# Patient Record
Sex: Male | Born: 1945
Health system: Southern US, Community
[De-identification: ages and names within clinical notes are randomized; demographics above are authoritative.]

## PROBLEM LIST (undated history)

## (undated) DIAGNOSIS — R600 Localized edema: Secondary | ICD-10-CM

## (undated) DIAGNOSIS — L03119 Cellulitis of unspecified part of limb: Secondary | ICD-10-CM

## (undated) DIAGNOSIS — I471 Supraventricular tachycardia, unspecified: Secondary | ICD-10-CM

## (undated) DIAGNOSIS — M5416 Radiculopathy, lumbar region: Secondary | ICD-10-CM

## (undated) DIAGNOSIS — M109 Gout, unspecified: Secondary | ICD-10-CM

## (undated) DIAGNOSIS — M1990 Unspecified osteoarthritis, unspecified site: Secondary | ICD-10-CM

## (undated) DIAGNOSIS — L509 Urticaria, unspecified: Secondary | ICD-10-CM

## (undated) HISTORY — DX: Supraventricular tachycardia: I47.1

## (undated) HISTORY — DX: Urticaria, unspecified: L50.9

## (undated) HISTORY — PX: APPENDECTOMY: SHX54

## (undated) HISTORY — DX: Localized edema: R60.0

## (undated) HISTORY — DX: Supraventricular tachycardia, unspecified: I47.10

## (undated) HISTORY — DX: Radiculopathy, lumbar region: M54.16

## (undated) HISTORY — DX: Unspecified osteoarthritis, unspecified site: M19.90

## (undated) HISTORY — DX: Cellulitis of unspecified part of limb: L03.119

---

## 2003-07-06 ENCOUNTER — Ambulatory Visit (HOSPITAL_COMMUNITY): Admission: RE | Admit: 2003-07-06 | Discharge: 2003-07-06 | Payer: Self-pay | Admitting: Internal Medicine

## 2003-12-16 ENCOUNTER — Ambulatory Visit (HOSPITAL_COMMUNITY): Admission: RE | Admit: 2003-12-16 | Discharge: 2003-12-16 | Payer: Self-pay | Admitting: Internal Medicine

## 2009-12-18 ENCOUNTER — Ambulatory Visit: Payer: Self-pay | Admitting: Family Medicine

## 2010-01-15 ENCOUNTER — Emergency Department (HOSPITAL_COMMUNITY): Admission: EM | Admit: 2010-01-15 | Discharge: 2010-01-16 | Payer: Self-pay | Admitting: Emergency Medicine

## 2010-01-23 ENCOUNTER — Ambulatory Visit: Payer: Self-pay | Admitting: Internal Medicine

## 2010-01-23 ENCOUNTER — Encounter (INDEPENDENT_AMBULATORY_CARE_PROVIDER_SITE_OTHER): Payer: Self-pay | Admitting: Family Medicine

## 2010-01-23 LAB — CONVERTED CEMR LAB
ALT: 8 units/L (ref 0–53)
AST: 12 units/L (ref 0–37)
Alkaline Phosphatase: 55 units/L (ref 39–117)
BUN: 11 mg/dL (ref 6–23)
Creatinine, Ser: 1.2 mg/dL (ref 0.40–1.50)
HDL: 37 mg/dL — ABNORMAL LOW (ref >39)
LDL Cholesterol: 163 mg/dL — ABNORMAL HIGH (ref 0–99)
Potassium: 3.8 meq/L (ref 3.5–5.3)
Total CHOL/HDL Ratio: 5.8

## 2010-01-29 ENCOUNTER — Ambulatory Visit (HOSPITAL_COMMUNITY): Admission: RE | Admit: 2010-01-29 | Discharge: 2010-01-29 | Payer: Self-pay | Admitting: Family Medicine

## 2010-02-26 ENCOUNTER — Ambulatory Visit: Payer: Self-pay | Admitting: Internal Medicine

## 2010-03-07 ENCOUNTER — Observation Stay (HOSPITAL_COMMUNITY): Admission: EM | Admit: 2010-03-07 | Discharge: 2010-03-08 | Payer: Self-pay | Admitting: Emergency Medicine

## 2010-03-09 ENCOUNTER — Emergency Department (HOSPITAL_COMMUNITY)
Admission: EM | Admit: 2010-03-09 | Discharge: 2010-03-10 | Payer: Self-pay | Source: Home / Self Care | Admitting: Emergency Medicine

## 2010-05-31 ENCOUNTER — Encounter: Payer: Self-pay | Admitting: Internal Medicine

## 2010-07-23 LAB — CBC
HCT: 44.3 % (ref 39.0–52.0)
Hemoglobin: 15.4 g/dL (ref 13.0–17.0)
MCH: 31.3 pg (ref 26.0–34.0)
MCHC: 34.4 g/dL (ref 30.0–36.0)
MCV: 90.3 fL (ref 78.0–100.0)
Platelets: 214 10*3/uL (ref 150–400)
Platelets: 223 10*3/uL (ref 150–400)
RBC: 4.8 MIL/uL (ref 4.22–5.81)
RBC: 4.91 MIL/uL (ref 4.22–5.81)
RDW: 14.8 % (ref 11.5–15.5)
WBC: 4.1 10*3/uL (ref 4.0–10.5)

## 2010-07-23 LAB — URINALYSIS, ROUTINE W REFLEX MICROSCOPIC
Bilirubin Urine: NEGATIVE
Glucose, UA: NEGATIVE mg/dL
Hgb urine dipstick: NEGATIVE
Ketones, ur: NEGATIVE mg/dL
Protein, ur: NEGATIVE mg/dL

## 2010-07-23 LAB — DIFFERENTIAL
Basophils Absolute: 0 10*3/uL (ref 0.0–0.1)
Basophils Relative: 0 % (ref 0–1)
Eosinophils Relative: 2 % (ref 0–5)
Monocytes Absolute: 0.3 10*3/uL (ref 0.1–1.0)
Neutro Abs: 2.5 10*3/uL (ref 1.7–7.7)

## 2010-07-23 LAB — COMPREHENSIVE METABOLIC PANEL
AST: 15 U/L (ref 0–37)
Albumin: 4.1 g/dL (ref 3.5–5.2)
Albumin: 4.2 g/dL (ref 3.5–5.2)
Alkaline Phosphatase: 52 U/L (ref 39–117)
BUN: 11 mg/dL (ref 6–23)
Calcium: 9.3 mg/dL (ref 8.4–10.5)
Chloride: 108 mEq/L (ref 96–112)
Creatinine, Ser: 1.1 mg/dL (ref 0.4–1.5)
GFR calc Af Amer: >60 mL/min (ref >60)
Glucose, Bld: 89 mg/dL (ref 70–99)
Potassium: 3.8 mEq/L (ref 3.5–5.1)
Total Bilirubin: 0.7 mg/dL (ref 0.3–1.2)
Total Protein: 7.9 g/dL (ref 6.0–8.3)

## 2010-07-23 LAB — TSH: TSH: 0.588 u[IU]/mL (ref 0.350–4.500)

## 2010-10-20 ENCOUNTER — Emergency Department (HOSPITAL_COMMUNITY)
Admission: EM | Admit: 2010-10-20 | Discharge: 2010-10-20 | Disposition: A | Discharge status: Home / Self Care | Payer: Medicare Other | Attending: Emergency Medicine | Admitting: Emergency Medicine

## 2010-10-20 DIAGNOSIS — M25529 Pain in unspecified elbow: Secondary | ICD-10-CM | POA: Insufficient documentation

## 2010-10-20 DIAGNOSIS — E785 Hyperlipidemia, unspecified: Secondary | ICD-10-CM | POA: Insufficient documentation

## 2010-10-20 DIAGNOSIS — M109 Gout, unspecified: Secondary | ICD-10-CM | POA: Insufficient documentation

## 2010-10-20 DIAGNOSIS — R0602 Shortness of breath: Secondary | ICD-10-CM | POA: Insufficient documentation

## 2010-10-20 DIAGNOSIS — I10 Essential (primary) hypertension: Secondary | ICD-10-CM | POA: Insufficient documentation

## 2010-10-20 DIAGNOSIS — M25519 Pain in unspecified shoulder: Secondary | ICD-10-CM | POA: Insufficient documentation

## 2010-10-20 LAB — POCT I-STAT, CHEM 8
BUN: 17 mg/dL (ref 6–23)
Chloride: 107 mEq/L (ref 96–112)
HCT: 43 % (ref 39.0–52.0)
Sodium: 139 mEq/L (ref 135–145)

## 2010-11-04 ENCOUNTER — Emergency Department (HOSPITAL_COMMUNITY)
Admission: EM | Admit: 2010-11-04 | Discharge: 2010-11-04 | Disposition: A | Discharge status: Home / Self Care | Payer: Medicare Other | Attending: Emergency Medicine | Admitting: Emergency Medicine

## 2010-11-04 DIAGNOSIS — M25439 Effusion, unspecified wrist: Secondary | ICD-10-CM | POA: Insufficient documentation

## 2010-11-04 DIAGNOSIS — M109 Gout, unspecified: Secondary | ICD-10-CM | POA: Insufficient documentation

## 2010-11-04 DIAGNOSIS — E785 Hyperlipidemia, unspecified: Secondary | ICD-10-CM | POA: Insufficient documentation

## 2010-11-04 DIAGNOSIS — I10 Essential (primary) hypertension: Secondary | ICD-10-CM | POA: Insufficient documentation

## 2010-11-04 DIAGNOSIS — M25539 Pain in unspecified wrist: Secondary | ICD-10-CM | POA: Insufficient documentation

## 2010-11-04 DIAGNOSIS — M7989 Other specified soft tissue disorders: Secondary | ICD-10-CM | POA: Insufficient documentation

## 2013-02-22 ENCOUNTER — Encounter (HOSPITAL_COMMUNITY): Payer: Self-pay | Admitting: Emergency Medicine

## 2013-02-22 ENCOUNTER — Emergency Department (HOSPITAL_COMMUNITY)
Admission: EM | Admit: 2013-02-22 | Discharge: 2013-02-22 | Disposition: A | Discharge status: Home / Self Care | Payer: Medicare PPO | Attending: Emergency Medicine | Admitting: Emergency Medicine

## 2013-02-22 DIAGNOSIS — R0789 Other chest pain: Secondary | ICD-10-CM

## 2013-02-22 DIAGNOSIS — R209 Unspecified disturbances of skin sensation: Secondary | ICD-10-CM | POA: Insufficient documentation

## 2013-02-22 DIAGNOSIS — R7989 Other specified abnormal findings of blood chemistry: Secondary | ICD-10-CM | POA: Insufficient documentation

## 2013-02-22 DIAGNOSIS — R0602 Shortness of breath: Secondary | ICD-10-CM | POA: Insufficient documentation

## 2013-02-22 DIAGNOSIS — R000 Tachycardia, unspecified: Secondary | ICD-10-CM | POA: Insufficient documentation

## 2013-02-22 DIAGNOSIS — F411 Generalized anxiety disorder: Secondary | ICD-10-CM | POA: Insufficient documentation

## 2013-02-22 DIAGNOSIS — F17200 Nicotine dependence, unspecified, uncomplicated: Secondary | ICD-10-CM | POA: Insufficient documentation

## 2013-02-22 LAB — CBC
HCT: 49.1 % (ref 39.0–52.0)
MCH: 33.5 pg (ref 26.0–34.0)
MCV: 96.8 fL (ref 78.0–100.0)
Platelets: 250 10*3/uL (ref 150–400)
RBC: 5.07 MIL/uL (ref 4.22–5.81)
RDW: 15.2 % (ref 11.5–15.5)

## 2013-02-22 LAB — RAPID URINE DRUG SCREEN, HOSP PERFORMED
Amphetamines: NOT DETECTED
Cocaine: NOT DETECTED
Opiates: NOT DETECTED
Tetrahydrocannabinol: POSITIVE — AB

## 2013-02-22 LAB — POCT I-STAT, CHEM 8
BUN: 14 mg/dL (ref 6–23)
Creatinine, Ser: 1.4 mg/dL — ABNORMAL HIGH (ref 0.50–1.35)
Glucose, Bld: 112 mg/dL — ABNORMAL HIGH (ref 70–99)
HCT: 57 % — ABNORMAL HIGH (ref 39.0–52.0)
Hemoglobin: 19.4 g/dL — ABNORMAL HIGH (ref 13.0–17.0)
Potassium: 4 mEq/L (ref 3.5–5.1)
TCO2: 21 mmol/L (ref 0–100)

## 2013-02-22 NOTE — ED Notes (Signed)
Lab called about Troponin test result, stated Troponin is running right now.

## 2013-02-22 NOTE — ED Provider Notes (Signed)
CSN: [id number]     Arrival date & time 02/22/13  1435 History   First MD Initiated Contact with Patient 02/22/13 1545     Chief Complaint  Patient presents with  . Anxiety   (Consider location/radiation/quality/duration/timing/severity/associated sxs/prior Treatment) HPI  Patient is a 67 yo male with no past medical history as he has not seen a physician in many years who presents following episode of tingling fingers, sweating, and shortness of breath. Started around 3 pm following drinking alcohol and smoking marijuana earlier today. Notes no palpitations. Notes while en route to hospital developed discomfort at left collar bone and left chest without radiation. Notes has never had this before. Never had exertional chest pain or dyspnea. Is not on medications. Notes has not seen a physician in many years. Notes no other illicits. States does not drink every day.  History reviewed. No pertinent past medical history. No past surgical history on file. No family history on file. Notes family history of HTN and DM. History  Substance Use Topics  . Smoking status: Current Some Day Smoker  . Smokeless tobacco: Not on file  . Alcohol Use: Yes    Review of Systems  Respiratory: Positive for shortness of breath.   Cardiovascular:       Left sided chest discomfort  Gastrointestinal: Negative for nausea and abdominal pain.  Neurological: Negative for dizziness and light-headedness.       Tingling in hands bilaterally    Allergies  Review of patient's allergies indicates no known allergies.  Home Medications  No current outpatient prescriptions on file. BP 116/74  Pulse 115  Temp(Src) 97.8 F (36.6 C) (Oral)  Resp 20  SpO2 96% Physical Exam  Constitutional: He appears well-developed and well-nourished.  HENT:  Head: Normocephalic and atraumatic.  Mouth/Throat: Oropharynx is clear and moist.  Eyes: Pupils are equal, round, and reactive to light.  Neck: Neck supple.   Cardiovascular: Normal heart sounds.   Tachycardic   Pulmonary/Chest: Effort normal and breath sounds normal.  Abdominal: Soft. Bowel sounds are normal. He exhibits no distension. There is no tenderness.  Musculoskeletal: He exhibits no edema.  Neurological: He is alert.  Skin: Skin is warm and dry.    ED Course  Procedures (including critical care time) Labs Review Labs Reviewed  URINE RAPID DRUG SCREEN (HOSP PERFORMED) - Abnormal; Notable for the following:    Tetrahydrocannabinol POSITIVE (*)    All other components within normal limits  ETHANOL - Abnormal; Notable for the following:    Alcohol, Ethyl (B) 179 (*)    All other components within normal limits  POCT I-STAT, CHEM 8 - Abnormal; Notable for the following:    Creatinine, Ser 1.40 (*)    Glucose, Bld 112 (*)    Calcium, Ion 1.05 (*)    Hemoglobin 19.4 (*)    HCT 57.0 (*)    All other components within normal limits  CBC  TROPONIN I  TROPONIN T   Imaging Review No results found.  EKG Interpretation   None      EKG: sinus tachycardia, no apparent ST or T wave changes  MDM   1. Chest discomfort   2. Elevated serum creatinine    4:00 pm: patient seen and examined. Was drinking and had smoked marijuana when he states all of the sudden he developed a tingling in his bilateral fingers, diaphoresis, and shortness of breath.  He felt no palpitations at that time. En route to the hospital he developed discomfort starting at  his left collar bone and left chest without further radiation. This concerning for cardiac cause of symptoms. Patient is back to base line at this time. Will check troponin given recent onset of symptoms. Will additionally obtain drug screen as patient smoked marijuana, concern is for additional drugs in the marijuana he smoked. Will check alcohol level as well. Will check CBC and i-stat chem 8. Patient states is at his baseline at this time.  6:00 pm: spoke with patient, states he is feeling  well at this time. Wants to know that everything is ok. He states he is ready to leave once his labs return. Note UDS positive for THC. Renal function with Cr of 1.4, previously was 1-1.1 2 years ago. Hgb 17. Will await return of troponin.  6:40 pm: troponin returned as normal. Discussed that we would like to recheck this to have 2 negative troponins prior to discharge from the ED. Patient stated he was ready to leave and did not want to wait for an additional lab result as he felt fine at this time. Most likely this episode was related to his alcohol consumption or marijuana use. No indication of ACS on labs or EKG. Patient given resource guide for PCP follow-up. Advised to follow-up with primary doctor regarding likely chronic kidney disease. Given return precautions.  This patient was discussed and seen with my attending Dr Anitra Lauth.  Marikay Alar, MD [facility] PGY-2 02/22/13 6:57 pm  Glori Luis, MD 02/22/13 2231

## 2013-02-22 NOTE — Progress Notes (Signed)
   CARE MANAGEMENT ED NOTE 02/22/2013  Patient:  Sergio Montgomery, Sergio Montgomery   Account Number:  [id number]  Date Initiated:  02/22/2013  Documentation initiated by:  Sergio Montgomery  Subjective/Objective Assessment:   67 Year old male medicare Sergio Montgomery resident c/o anxiety without pcp listed in EPIC Pt agrees to receive a list of medicare pcps within a radius of his zip code and has show interest in Dr Sergio Montgomery services     Subjective/Objective Assessment Detail:   Pt confirms no pcp but had been looking for one     Action/Plan:   Cm spoke with pt Cm provided pt with a 6 page list of medicare pcps within 5 mile radius of [phone] and verified Sergio Montgomery is an active medicare provider for [phone] zip code   Action/Plan Detail:   Anticipated DC Date:  02/22/2013     Status Recommendation to Physician:   Result of Recommendation:    Other ED Services  Consult Working Plan    DC Planning Services  Other  Outpatient Services - Pt will follow up  PCP issues    Choice offered to / List presented to:            Status of service:  Completed, signed off  ED Comments:   ED Comments Detail:

## 2013-02-22 NOTE — ED Notes (Signed)
Bed: WTR9 Expected date:  Expected time:  Means of arrival:  Comments: 

## 2013-02-22 NOTE — ED Notes (Signed)
Pupils pinpoint.  Pt states he had liquor, beer, and marijuana today.

## 2013-02-22 NOTE — ED Notes (Signed)
Lab called, person from lab stated Troponin was never run because it was ordered incorrectly. Requested that Troponin be re-ordered.

## 2013-02-22 NOTE — ED Notes (Signed)
Per EMS: Pt from home.  Was watching TV when he "started feeling weird".  Pt cannot describe it.  Just states it doesn't feel normal, pt feels nervous.  Pt was hyperventilating when EMS arrived, tachy at 140.  Pt now HR 113.  Sinus tach on monitor.  No pain.  Tingling in hands.

## 2013-02-23 ENCOUNTER — Telehealth (HOSPITAL_COMMUNITY): Payer: Self-pay | Admitting: Emergency Medicine

## 2013-02-23 NOTE — ED Provider Notes (Signed)
I saw and evaluated the patient, reviewed the resident's note and I agree with the findings and plan. I have reviewed EKG and agree with the resident interpretation.  you Pt with arm tingling/chest pain that started after drinking and using marijuana.  Pt denies any sx now.  Iniital labs including troponin and EKG wnl.  Discussed delta trop but pt wants to leave.  Gwyneth Sprout, MD 02/23/13 [phone]

## 2013-02-23 NOTE — ED Notes (Signed)
Call from lab not enough blood for Trop T.  Pt had (-) Trop I.

## 2013-05-16 ENCOUNTER — Ambulatory Visit (HOSPITAL_COMMUNITY)
Admission: RE | Admit: 2013-05-16 | Discharge: 2013-05-16 | Disposition: A | Discharge status: Home / Self Care | Payer: Medicare PPO | Source: Ambulatory Visit | Attending: Pulmonary Disease | Admitting: Pulmonary Disease

## 2013-05-16 ENCOUNTER — Other Ambulatory Visit (HOSPITAL_COMMUNITY): Payer: Self-pay | Admitting: Pulmonary Disease

## 2013-05-16 DIAGNOSIS — R05 Cough: Secondary | ICD-10-CM

## 2013-05-16 DIAGNOSIS — R059 Cough, unspecified: Secondary | ICD-10-CM | POA: Insufficient documentation

## 2013-05-16 DIAGNOSIS — Z87891 Personal history of nicotine dependence: Secondary | ICD-10-CM | POA: Insufficient documentation

## 2013-08-03 ENCOUNTER — Ambulatory Visit (HOSPITAL_COMMUNITY)
Admission: RE | Admit: 2013-08-03 | Discharge: 2013-08-03 | Disposition: A | Discharge status: Home / Self Care | Payer: Medicare PPO | Source: Ambulatory Visit | Attending: Pulmonary Disease | Admitting: Pulmonary Disease

## 2013-08-03 ENCOUNTER — Other Ambulatory Visit (HOSPITAL_COMMUNITY): Payer: Self-pay | Admitting: Pulmonary Disease

## 2013-08-03 DIAGNOSIS — R222 Localized swelling, mass and lump, trunk: Secondary | ICD-10-CM

## 2013-08-03 DIAGNOSIS — M25559 Pain in unspecified hip: Secondary | ICD-10-CM | POA: Insufficient documentation

## 2013-08-03 DIAGNOSIS — M25569 Pain in unspecified knee: Secondary | ICD-10-CM | POA: Insufficient documentation

## 2013-08-03 DIAGNOSIS — M25469 Effusion, unspecified knee: Secondary | ICD-10-CM | POA: Insufficient documentation

## 2013-08-03 DIAGNOSIS — R52 Pain, unspecified: Secondary | ICD-10-CM

## 2015-04-18 ENCOUNTER — Emergency Department (HOSPITAL_COMMUNITY)
Admission: EM | Admit: 2015-04-18 | Discharge: 2015-04-18 | Disposition: A | Discharge status: Home / Self Care | Payer: Medicare HMO | Attending: Emergency Medicine | Admitting: Emergency Medicine

## 2015-04-18 ENCOUNTER — Encounter (HOSPITAL_COMMUNITY): Payer: Self-pay | Admitting: Emergency Medicine

## 2015-04-18 DIAGNOSIS — F17210 Nicotine dependence, cigarettes, uncomplicated: Secondary | ICD-10-CM | POA: Diagnosis not present

## 2015-04-18 DIAGNOSIS — R21 Rash and other nonspecific skin eruption: Secondary | ICD-10-CM

## 2015-04-18 DIAGNOSIS — L299 Pruritus, unspecified: Secondary | ICD-10-CM | POA: Diagnosis present

## 2015-04-18 MED ORDER — PREDNISONE 10 MG (21) PO TBPK: 40.0000 mg | ORAL_TABLET | Freq: Every day | ORAL | Status: DC

## 2015-04-18 MED ORDER — HYDROCORTISONE 1 % EX CREA
TOPICAL_CREAM | Freq: Three times a day (TID) | CUTANEOUS | Status: DC
  Administered 2015-04-18: 15:00:00 via TOPICAL
  Filled 2015-04-18 (×2): qty 28

## 2015-04-18 MED ORDER — DIPHENHYDRAMINE HCL 25 MG PO TABS: 25.0000 mg | ORAL_TABLET | Freq: Four times a day (QID) | ORAL | Status: DC

## 2015-04-18 MED ORDER — PREDNISONE 20 MG PO TABS
40.0000 mg | ORAL_TABLET | Freq: Once | ORAL | Status: AC
  Administered 2015-04-18: 40 mg via ORAL
  Filled 2015-04-18: qty 2

## 2015-04-18 NOTE — ED Notes (Signed)
Pt reports hives x 3 days with no new foods, meds, hygiene products.

## 2015-04-18 NOTE — ED Notes (Signed)
Pt reports generalized hives x 3 days. Pt reports that he has been taking benadryl with no relief. Pt denies difficulty breathing. Pt alert x4. NAD at this time.

## 2015-04-18 NOTE — ED Provider Notes (Signed)
History  By signing my name below, I, Marlowe Kays, attest that this documentation has been prepared under the direction and in the presence of [employer], PA-C. Electronically Signed: Marlowe Kays, ED Scribe. 04/18/2015. 2:50 PM.  Chief Complaint  Patient presents with  . Urticaria   The history is provided by the patient and medical records. No language interpreter was used.    HPI Comments:  Sergio Montgomery is a 69 y.o. male who presents to the Emergency Department complaining of diffuse pruritic urticaria that began approximately three days ago. Pt reports the hives are located all over his body. He has been applying Hydrocortisone cream and taking Benadryl with some relief of the symptoms. He denies modifying factors. He denies any new lotions, creams, detergents, soaps or other personal hygiene products. He denies any recent yard work. He denies fever or chills. He denies any chronic health issues or any daily medications. He denies having a PCP.  History reviewed. No pertinent past medical history. Past Surgical History  Procedure Laterality Date  . Appendectomy     No family history on file. Social History  Substance Use Topics  . Smoking status: Current Every Day Smoker -- 0.50 packs/day    Types: Cigarettes  . Smokeless tobacco: None  . Alcohol Use: Yes    Review of Systems  Constitutional: Negative for fever and chills.  Skin: Positive for rash.    Allergies  Review of patient's allergies indicates no known allergies.  Home Medications   Prior to Admission medications   Medication Sig Start Date End Date Taking? Authorizing Provider  diphenhydrAMINE (BENADRYL) 25 MG tablet Take 1 tablet (25 mg total) by mouth every 6 (six) hours. 04/18/15   Jovanne Riggenbach Patel-Mills, PA-C  predniSONE (STERAPRED UNI-PAK 21 TAB) 10 MG (21) TBPK tablet Take 4 tablets (40 mg total) by mouth daily. 40mg  a day for 5 days 04/18/15   Ottie Glazier, PA-C   Triage Vitals: BP 129/81  mmHg  Pulse 92  Temp(Src) 97.8 F (36.6 C) (Oral)  Resp 18  SpO2 96% Physical Exam  Constitutional: He is oriented to person, place, and time. He appears well-developed and well-nourished.  HENT:  Head: Normocephalic and atraumatic.  No throat swelling or facial swelling.  Eyes: EOM are normal.  Neck: Normal range of motion.  Cardiovascular: Normal rate.   Pulmonary/Chest: Effort normal.  Musculoskeletal: Normal range of motion.  Neurological: He is alert and oriented to person, place, and time.  Skin: Skin is warm and dry. Rash noted.  Erythematous, raised patches with no particular shape along bilateral inner thighs and posterior neck without drainage or weeping.  Psychiatric: He has a normal mood and affect. His behavior is normal.  Nursing note and vitals reviewed.   ED Course  Procedures (including critical care time) DIAGNOSTIC STUDIES: Oxygen Saturation is 96% on RA, adequate by my interpretation.   COORDINATION OF CARE: 2:44 PM- Will prescribe Prednisone. Advised pt to take OTC Benadryl for continued itching. Pt verbalizes understanding and agrees to plan.  Medications  hydrocortisone cream 1 % ( Topical Given 04/18/15 1521)  predniSONE (DELTASONE) tablet 40 mg (40 mg Oral Given 04/18/15 1521)     MDM   Final diagnoses:  Rash   Patient with urticarial eruption. No specific trigger. No signs of anaphylactic reaction; no new medications. Will treat with Prednisone, OTC Benadryl and Hydrocortisone cream. Will give pt resources to follow up and establish care with PCP. Return precautions discussed. Pt is safe for discharge at this time.  I personally performed the services described in this documentation, which was scribed in my presence. The recorded information has been reviewed and is accurate.     Ottie Glazier, PA-C 04/18/15 [place], MD 04/21/15 [phone]

## 2015-04-18 NOTE — Discharge Instructions (Signed)
Allergies °An allergy is when your body reacts to a substance in a way that is not normal. An allergic reaction can happen after you: °· Eat something. °· Breathe in something. °· Touch something. °WHAT KINDS OF ALLERGIES ARE THERE? °You can be allergic to: °· Things that are only around during certain seasons, like molds and pollens. °· Foods. °· Drugs. °· Insects. °· Animal dander. °WHAT ARE SYMPTOMS OF ALLERGIES? °· Puffiness (swelling). This may happen on the lips, face, tongue, mouth, or throat. °· Sneezing. °· Coughing. °· Breathing loudly (wheezing). °· Stuffy nose. °· Tingling in the mouth. °· A rash. °· Itching. °· Itchy, red, puffy areas of skin (hives). °· Watery eyes. °· Throwing up (vomiting). °· Watery poop (diarrhea). °· Dizziness. °· Feeling faint or fainting. °· Trouble breathing or swallowing. °· A tight feeling in the chest. °· A fast heartbeat. °HOW ARE ALLERGIES DIAGNOSED? °Allergies can be diagnosed with: °· A medical and family history. °· Skin tests. °· Blood tests. °· A food diary. A food diary is a record of all the foods, drinks, and symptoms you have each day. °· The results of an elimination diet. This diet involves making sure not to eat certain foods and then seeing what happens when you start eating them again. °HOW ARE ALLERGIES TREATED? °There is no cure for allergies, but allergic reactions can be treated with medicine. Severe reactions usually need to be treated at a hospital.  °HOW CAN REACTIONS BE PREVENTED? °The best way to prevent an allergic reaction is to avoid the thing you are allergic to. Allergy shots and medicines can also help prevent reactions in some cases. °  °This information is not intended to replace advice given to you by your health care provider. Make sure you discuss any questions you have with your health care provider. °  °Document Released: 08/22/2012 Document Revised: 05/18/2014 Document Reviewed: 02/06/2014 °Elsevier Interactive Patient Education ©2016  [employer] ° °

## 2015-06-03 ENCOUNTER — Encounter (HOSPITAL_COMMUNITY): Payer: Self-pay

## 2015-06-03 ENCOUNTER — Emergency Department (HOSPITAL_COMMUNITY)
Admission: EM | Admit: 2015-06-03 | Discharge: 2015-06-03 | Disposition: A | Discharge status: Home / Self Care | Payer: Medicare HMO | Attending: Emergency Medicine | Admitting: Emergency Medicine

## 2015-06-03 DIAGNOSIS — Y998 Other external cause status: Secondary | ICD-10-CM | POA: Diagnosis not present

## 2015-06-03 DIAGNOSIS — X58XXXA Exposure to other specified factors, initial encounter: Secondary | ICD-10-CM | POA: Diagnosis not present

## 2015-06-03 DIAGNOSIS — F17210 Nicotine dependence, cigarettes, uncomplicated: Secondary | ICD-10-CM | POA: Diagnosis not present

## 2015-06-03 DIAGNOSIS — Z79899 Other long term (current) drug therapy: Secondary | ICD-10-CM | POA: Diagnosis not present

## 2015-06-03 DIAGNOSIS — T783XXA Angioneurotic edema, initial encounter: Secondary | ICD-10-CM | POA: Diagnosis present

## 2015-06-03 DIAGNOSIS — Y9389 Activity, other specified: Secondary | ICD-10-CM | POA: Insufficient documentation

## 2015-06-03 DIAGNOSIS — Y9289 Other specified places as the place of occurrence of the external cause: Secondary | ICD-10-CM | POA: Diagnosis not present

## 2015-06-03 MED ORDER — PREDNISONE 20 MG PO TABS: ORAL_TABLET | ORAL | Status: DC

## 2015-06-03 MED ORDER — EPINEPHRINE 0.3 MG/0.3ML IJ SOAJ
0.3000 mg | Freq: Once | INTRAMUSCULAR | Status: AC
Start: 2015-06-03 — End: 2015-06-03
  Administered 2015-06-03: 0.3 mg via INTRAMUSCULAR
  Filled 2015-06-03: qty 0.3

## 2015-06-03 MED ORDER — EPINEPHRINE 0.3 MG/0.3ML IJ SOAJ: 0.3000 mg | Freq: Once | INTRAMUSCULAR | Status: DC

## 2015-06-03 MED ORDER — METHYLPREDNISOLONE SODIUM SUCC 125 MG IJ SOLR
125.0000 mg | Freq: Once | INTRAMUSCULAR | Status: AC
  Administered 2015-06-03: 125 mg via INTRAVENOUS
  Filled 2015-06-03: qty 2

## 2015-06-03 MED ORDER — FAMOTIDINE IN NACL 20-0.9 MG/50ML-% IV SOLN
20.0000 mg | Freq: Once | INTRAVENOUS | Status: AC
  Administered 2015-06-03: 20 mg via INTRAVENOUS
  Filled 2015-06-03: qty 50

## 2015-06-03 NOTE — Discharge Instructions (Signed)
If you develop swelling of the tongue or throat use her EpiPen and call EMS. Follow-up closely with a primary doctor and allergy doctor.  If you were given medicines take as directed.  If you are on coumadin or contraceptives realize their levels and effectiveness is altered by many different medicines.  If you have any reaction (rash, tongues swelling, other) to the medicines stop taking and see a physician.    If your blood pressure was elevated in the ER make sure you follow up for management with a primary doctor or return for chest pain, shortness of breath or stroke symptoms.  Please follow up as directed and return to the ER or see a physician for new or worsening symptoms.  Thank you. Filed Vitals:   06/03/15 0804 06/03/15 0900 06/03/15 0922 06/03/15 1000  BP: 146/99 120/84 120/84 131/82  Pulse: 95 80 82 80  Temp:      TempSrc:      Resp: 14 14 18 17   Weight:      SpO2: 99% 99% 98% 99%

## 2015-06-03 NOTE — ED Notes (Addendum)
Patient here with tongue and lip swelling that started at 0200 this am. Has no new meds, no changes in diet or products that he is aware of, reports hard time handling secretions. Took benadryl 50mg  around 0200-0300

## 2015-06-03 NOTE — ED Notes (Signed)
MD at bedside. 

## 2015-06-03 NOTE — ED Provider Notes (Signed)
CSN: [id number]     Arrival date & time 06/03/15  0745 History   First MD Initiated Contact with Patient 06/03/15 [phone]     Chief Complaint  Patient presents with  . lip and tongue swelling      (Consider location/radiation/quality/duration/timing/severity/associated sxs/prior Treatment) HPI Comments: 70 year old male with smoking history denies any other medical problems presents for tongue swelling and lip swelling with rash that started 2:00 this morning. Patient has had milder variations over the past month. Patient was seen once in the past for this. Patient denies any blood pressure medicines or ACE inhibitor's. Patient has no family history of significant allergies. He denies any new exposures including medicines lotions etc.  The history is provided by the patient.    History reviewed. No pertinent past medical history. Past Surgical History  Procedure Laterality Date  . Appendectomy     No family history on file. Social History  Substance Use Topics  . Smoking status: Current Every Day Smoker -- 0.50 packs/day    Types: Cigarettes  . Smokeless tobacco: None  . Alcohol Use: Yes    Review of Systems  Constitutional: Negative for fever and chills.  HENT: Negative for congestion.   Eyes: Negative for visual disturbance.  Respiratory: Negative for shortness of breath.   Cardiovascular: Negative for chest pain.  Gastrointestinal: Negative for vomiting and abdominal pain.  Genitourinary: Negative for dysuria and flank pain.  Musculoskeletal: Negative for back pain, neck pain and neck stiffness.  Skin: Positive for rash.  Neurological: Negative for light-headedness and headaches.      Allergies  Other  Home Medications   Prior to Admission medications   Medication Sig Start Date End Date Taking? Authorizing Provider  diphenhydrAMINE (BENADRYL) 25 MG tablet Take 1 tablet (25 mg total) by mouth every 6 (six) hours. Patient taking differently: Take 25-50 mg by mouth  at bedtime. For hives all over at night 04/18/15  Yes Hanna Patel-Mills, PA-C  hydrocortisone cream 0.5 % Apply 1 application topically at bedtime.   Yes Historical Provider, MD  ibuprofen (ADVIL,MOTRIN) 200 MG tablet Take 200 mg by mouth daily as needed for headache or mild pain.   Yes Historical Provider, MD  EPINEPHrine 0.3 mg/0.3 mL IJ SOAJ injection Inject 0.3 mLs (0.3 mg total) into the muscle once. 06/03/15   Elnora Morrison, MD  predniSONE (DELTASONE) 20 MG tablet 3 tabs po day one, then 2 tabs daily x 4 days 06/03/15   Elnora Morrison, MD  predniSONE (STERAPRED UNI-PAK 21 TAB) 10 MG (21) TBPK tablet Take 4 tablets (40 mg total) by mouth daily. 40mg  a day for 5 days Patient not taking: Reported on 06/03/2015 04/18/15   Hanna Patel-Mills, PA-C   BP 131/82 mmHg  Pulse 80  Temp(Src) 97.9 F (36.6 C) (Oral)  Resp 17  Wt 198 lb (89.812 kg)  SpO2 99% Physical Exam  Constitutional: He is oriented to person, place, and time. He appears well-developed and well-nourished.  HENT:  Head: Normocephalic and atraumatic.  Patient has upper and lower lip edema moderate, left tongue edema and mild speech changes. No stridor no respiratory difficulty.  Eyes: Right eye exhibits no discharge. Left eye exhibits no discharge.  Neck: Normal range of motion. Neck supple. No tracheal deviation present.  Cardiovascular: Normal rate and regular rhythm.   Pulmonary/Chest: Effort normal and breath sounds normal.  Abdominal: Soft. He exhibits no distension. There is no tenderness. There is no guarding.  Musculoskeletal: He exhibits no edema.  Neurological: He is  alert and oriented to person, place, and time.  Skin: Skin is warm. No rash noted.  Psychiatric: He has a normal mood and affect.  Nursing note and vitals reviewed.   ED Course  Procedures (including critical care time) CRITICAL CARE Performed by: Mariea Clonts   Total critical care time: 35 minutes  Critical care time was exclusive of separately  billable procedures and treating other patients.  Critical care was necessary to treat or prevent imminent or life-threatening deterioration.  Critical care was time spent personally by me on the following activities: development of treatment plan with patient and/or surrogate as well as nursing, discussions with consultants, evaluation of patient's response to treatment, examination of patient, obtaining history from patient or surrogate, ordering and performing treatments and interventions, ordering and review of laboratory studies, ordering and review of radiographic studies, pulse oximetry and re-evaluation of patient's condition.  Labs Review Labs Reviewed - No data to display  Imaging Review No results found. I have personally reviewed and evaluated these images and lab results as part of my medical decision-making.   EKG Interpretation None      MDM   Final diagnoses:  Angioedema, initial encounter   Patient presents with angioedema/anaphylaxis. No obvious source. No ACE inhibitor use. Plan for EpiPen steroids and observation in the ED. Patient will need close follow-up with allergist with recurrent swelling the past month.  Patient had Benadryl prior to arrival with mild improvement of symptoms rash resolved. Patient improved on reassessment. Plan for steroids, Benadryl and EpiPen for home. Patient needs to see allergy doctors as soon as possible Results and differential diagnosis were discussed with the patient/parent/guardian. Xrays were independently reviewed by myself.  Close follow up outpatient was discussed, comfortable with the plan.   Medications  methylPREDNISolone sodium succinate (SOLU-MEDROL) 125 mg/2 mL injection 125 mg (125 mg Intravenous Given 06/03/15 0828)  famotidine (PEPCID) IVPB 20 mg premix (0 mg Intravenous Stopped 06/03/15 0930)  EPINEPHrine (EPI-PEN) injection 0.3 mg (0.3 mg Intramuscular Given 06/03/15 0828)    Filed Vitals:   06/03/15 0804  06/03/15 0900 06/03/15 0922 06/03/15 1000  BP: 146/99 120/84 120/84 131/82  Pulse: 95 80 82 80  Temp:      TempSrc:      Resp: 14 14 18 17   Weight:      SpO2: 99% 99% 98% 99%    Final diagnoses:  Angioedema, initial encounter         Elnora Morrison, MD 06/03/15 1115

## 2015-06-11 ENCOUNTER — Encounter: Payer: Self-pay | Admitting: Allergy and Immunology

## 2015-06-11 ENCOUNTER — Ambulatory Visit (INDEPENDENT_AMBULATORY_CARE_PROVIDER_SITE_OTHER): Payer: Medicare HMO | Admitting: Allergy and Immunology

## 2015-06-11 VITALS — BP 128/84 | HR 100 | Temp 98.3°F | Resp 18 | Ht 73.62 in | Wt 191.8 lb

## 2015-06-11 DIAGNOSIS — L500 Allergic urticaria: Secondary | ICD-10-CM | POA: Insufficient documentation

## 2015-06-11 DIAGNOSIS — T783XXA Angioneurotic edema, initial encounter: Secondary | ICD-10-CM | POA: Diagnosis not present

## 2015-06-11 DIAGNOSIS — J310 Chronic rhinitis: Secondary | ICD-10-CM | POA: Diagnosis not present

## 2015-06-11 NOTE — Assessment & Plan Note (Addendum)
Recurrent urticaria. There is no obvious etiology identified. We were unable to perform skin tests today due to recent administration of antihistamine.   Sergio Montgomery is scheduled to return next week for allergy skin testing after having been off of antihistamines for at least 3 days.  To control symptoms while off of antihistamines for 3 days, prednisone has been provided: 10g per day for 3 days. Further recommendations will be made at that time based upon skin test results.  Instructions have been provided and discussed for H1/H2 receptor blockade with step-wise increase/decrease to find lowest effective dose.  Should severe or new symptoms occur, a journal is to be kept recording any foods eaten, beverages consumed, medications taken within a 6 hour period prior to the onset of symptoms, as well as record activities being performed, and environmental conditions. For any symptoms concerning for anaphylaxis, epinephrine is to be administered and 911 is to be called immediately.

## 2015-06-11 NOTE — Assessment & Plan Note (Signed)
   Associated angioedema occurs in up to 50% of cases of recurrent urticaria.  Treatment plan as outlined above.

## 2015-06-11 NOTE — Progress Notes (Signed)
New Patient Note  RE: Sergio Montgomery MRN: [id number] DOB: 16-Nov-1945 Date of Office Visit: 06/11/2015  Referring provider: Vincente Liberty, MD Primary care provider: Leola Brazil, MD  Chief Complaint: Pruritis and Urticaria   History of present illness: HPI Comments: Sergio Montgomery is a 70 y.o. male who presents today for consultation of hives.  Over the past 1 month, [name] has experienced recurrent episodes of hives. Typical distribution includes the entire body.  The lesions are described as erythematous, raised, and pruritic.  Individual hives last less than 24 hours without leaving residual pigmentation or bruising.  On one occasion, he experienced  Concomitant swelling of the tongue, and on another occasion swelling of the tongue, lips, and face.  He denies concomitant cardiopulmonary or other GI symptoms.  He has not experienced unexpected weight loss, recurrent fevers or drenching night sweats. No specific medication, food or environmental triggers have been identified. The symptoms do not seem to correlate with NSAIDs use. Symptoms do not seem to correlate with emotional stress. The patient did not have signs or symptoms of infection at the time of symptom onset. Sergio Montgomery has tried to control symptoms with over-the-counter diphenhydramine. This medication has offered fair temporary relief of symptoms. The patient has been evaluated and treated in the emergency department for these symptoms with epinephrine and antihistamines. He was prescribed an Epi-Pen and hydrocortisone cream. Skin biopsy has not been performed. Sergio Montgomery also complains of frequent sneezing fits, nasal congestion, rhinorrhea, and watery eyes. No significant seasonal symptom variation has been noted nor have specific environmental triggers been identified.   Assessment and plan: Urticaria Recurrent urticaria. There is no obvious etiology identified. We were unable to perform skin tests today due to recent administration of  antihistamine.   Sergio Montgomery is scheduled to return next week for allergy skin testing after having been off of antihistamines for at least 3 days.  To control symptoms while off of antihistamines for 3 days, prednisone has been provided: 10g per day for 3 days. Further recommendations will be made at that time based upon skin test results.  Instructions have been provided and discussed for H1/H2 receptor blockade with step-wise increase/decrease to find lowest effective dose.  Should severe or new symptoms occur, a journal is to be kept recording any foods eaten, beverages consumed, medications taken within a 6 hour period prior to the onset of symptoms, as well as record activities being performed, and environmental conditions. For any symptoms concerning for anaphylaxis, epinephrine is to be administered and 911 is to be called immediately.  Angioedema  Associated angioedema occurs in up to 50% of cases of recurrent urticaria.  Treatment plan as outlined above.  Chronic rhinitis  Recommendations will be made based upon next week's skin test results.   Diagnositics: We were unable to perform skin tests today due to recent administration of antihistamine.     Physical examination: Blood pressure 128/84, pulse 100, temperature 98.3 F (36.8 C), temperature source Oral, resp. rate 18, height 6' 1.62" (1.87 m), weight 191 lb 12.8 oz (87 kg).  General: Alert, interactive, in no acute distress. HEENT: TMs pearly gray, turbinates moderately edematous without discharge, post-pharynx mildly erythematous. Neck: Supple without lymphadenopathy. Lungs: Clear to auscultation without wheezing, rhonchi or rales. CV: Normal S1, S2 without murmurs. Abdomen: Nondistended, nontender. Skin: Scattered erythematous urticarial type lesions primarily located on the right lower extremity , nonvesicular. Extremities:  No clubbing, cyanosis or edema. Neuro:   Grossly intact.  Review of systems: Review of  Systems  Constitutional: Negative for fever, chills and weight loss.  HENT: Positive for congestion. Negative for nosebleeds.   Eyes: Negative for blurred vision.  Respiratory: Negative for hemoptysis, shortness of breath and wheezing.   Cardiovascular: Negative for chest pain.  Gastrointestinal: Negative for diarrhea and constipation.  Genitourinary: Negative for dysuria.  Musculoskeletal: Negative for myalgias and joint pain.  Skin: Positive for itching and rash.  Neurological: Negative for dizziness.  Endo/Heme/Allergies: Does not bruise/bleed easily.    Past medical history: Past Medical History  Diagnosis Date  . Urticaria   . Arthritis     Past surgical history: Past Surgical History  Procedure Laterality Date  . Appendectomy      Family history: Family History  Problem Relation Age of Onset  . Diabetes Sister   . Hypertension Brother     Social history: Social History   Social History  . Marital Status: Single    Spouse Name: N/A  . Number of Children: N/A  . Years of Education: N/A   Occupational History  . Not on file.   Social History Main Topics  . Smoking status: Current Every Day Smoker -- 0.50 packs/day    Types: Cigarettes  . Smokeless tobacco: Not on file  . Alcohol Use: Yes  . Drug Use: Yes    Special: Marijuana  . Sexual Activity: Not on file   Other Topics Concern  . Not on file   Social History Narrative   Environmental History: Sergio Montgomery lives in an apartment with carpeting throughout and central air/heat.  He is a cigarette smoker and has no pets.    Medication List       This list is accurate as of: 06/11/15 12:13 PM.  Always use your most recent med list.               diphenhydrAMINE 25 MG tablet  Commonly known as:  BENADRYL  Take 1 tablet (25 mg total) by mouth every 6 (six) hours.     EPINEPHrine 0.3 mg/0.3 mL Soaj injection  Commonly known as:  EPI-PEN  Inject 0.3 mLs (0.3 mg total) into the muscle once.      hydrocortisone cream 0.5 %  Apply 1 application topically at bedtime. Reported on 06/11/2015     ibuprofen 200 MG tablet  Commonly known as:  ADVIL,MOTRIN  Take 200 mg by mouth daily as needed for headache or mild pain.        Known medication allergies: Allergies  Allergen Reactions  . Other     Pt says he is not taking anything different--medication, food, not sure what he may be allergic to    I appreciate the opportunity to take part in this Paulmichael's care. Please do not hesitate to contact me with questions.  Sincerely,   R. Edgar Frisk, MD Who presents today for consultation of

## 2015-06-11 NOTE — Patient Instructions (Addendum)
Urticaria Recurrent urticaria. There is no obvious etiology identified. We were unable to perform skin tests today due to recent administration of antihistamine.   Sergio Montgomery is scheduled to return next week for allergy skin testing after having been off of antihistamines for at least 3 days.  To control symptoms while off of antihistamines for 3 days, prednisone has been provided: 10g per day for 3 days. Further recommendations will be made at that time based upon skin test results.  Instructions have been provided and discussed for H1/H2 receptor blockade with step-wise increase/decrease to find lowest effective dose.  Should severe or new symptoms occur, a journal is to be kept recording any foods eaten, beverages consumed, medications taken within a 6 hour period prior to the onset of symptoms, as well as record activities being performed, and environmental conditions. For any symptoms concerning for anaphylaxis, epinephrine is to be administered and 911 is to be called immediately.  Angioedema  Associated angioedema occurs in up to 50% of cases of recurrent urticaria.  Treatment plan as outlined above.  Chronic rhinitis  Recommendations will be made based upon next week's skin test results.   Return in about 1 week (around 06/18/2015) for Allergy skin testing.    Hives (urticaria)  . Cetirizine (Zyrtec) 10mg  twice a day and ranitidine (Zantac) 150 mg twice a day. If no symptoms for 7-14 days then decrease to. . Cetirizine (Zyrtec) 10mg  twice a day and ranitidine (Zantac) 150 mg once a day.  If no symptoms for 7-14 days then decrease to. . Cetirizine (Zyrtec) 10mg  twice a day.  If no symptoms for 7-14 days then decrease to. . Cetirizine (Zyrtec) 10mg  once a day.  May use Benadryl (diphenhydramine) as needed for breakthrough hives       If symptoms return, then step up dosage

## 2015-06-11 NOTE — Assessment & Plan Note (Addendum)
   Recommendations will be made based upon next week's skin test results.

## 2015-06-17 ENCOUNTER — Encounter: Payer: Self-pay | Admitting: *Deleted

## 2015-06-19 ENCOUNTER — Ambulatory Visit (INDEPENDENT_AMBULATORY_CARE_PROVIDER_SITE_OTHER): Payer: Medicare HMO | Admitting: Allergy and Immunology

## 2015-06-19 ENCOUNTER — Encounter: Payer: Self-pay | Admitting: Allergy and Immunology

## 2015-06-19 VITALS — BP 110/70 | HR 80 | Resp 20

## 2015-06-19 DIAGNOSIS — T783XXD Angioneurotic edema, subsequent encounter: Secondary | ICD-10-CM

## 2015-06-19 DIAGNOSIS — K2970 Gastritis, unspecified, without bleeding: Secondary | ICD-10-CM

## 2015-06-19 DIAGNOSIS — L500 Allergic urticaria: Secondary | ICD-10-CM

## 2015-06-19 DIAGNOSIS — J310 Chronic rhinitis: Secondary | ICD-10-CM | POA: Diagnosis not present

## 2015-06-19 MED ORDER — FLUTICASONE PROPIONATE 50 MCG/ACT NA SUSP: 1.0000 | Freq: Two times a day (BID) | NASAL | Status: DC

## 2015-06-19 NOTE — Patient Instructions (Addendum)
Urticaria Chronic urticaria. There is no obvious etiology identified. Skin tests to select food allergens were negative today. NSAIDs and emotional stress commonly exacerbate urticaria but are not the underlying etiology in this case. Physical urticarias are negative by history (i.e. pressure-induced, temperature, vibration, solar, etc.). History and lesions are not consistent with urticaria pigmentosa so I am not suspicious for mastocytosis. There are no concomitant symptoms concerning for anaphylaxis or constitutional symptoms worrisome for an underlying malignancy. We will rule out other potential etiologies with labs. For symptom relief, patient is to take oral antihistamines as directed.  The following labs have been ordered: FCeRI antibody, TSH, anti-thyroglobulin antibody, thyroid peroxidase antibody, C4 level, tryptase, urea breath test, CBC, CMP, ESR, ANA, and galactose-alpha-1,3-galactose IgE level.  The patient will be called with further recommendations after lab results have returned.  Instructions have been provided and discussed for H1/H2 receptor blockade with titration to find lowest effective dose.  A journal is to be kept recording any foods eaten, beverages consumed, medications taken within a 6 hour period prior to the onset of symptoms, as well as record activities being performed, and environmental conditions. For any symptoms concerning for anaphylaxis, 911 is to be called immediately.  Angioedema Associated angioedema occurs in up to 50% of patients with chronic urticaria.  Treatment/diagnostic plan as outlined above.  Chronic rhinitis Non-allergic rhinitis.  All seasonal and perennial aeroallergen skin tests are negative despite a positive histamine control.  Intranasal steroids and intranasal antihistamines are effective for symptoms associated with non-allergic rhinitis, whereas second generation antihistamines such as cetirizine, loratadine and fexofenadine have been  found to be ineffective for this condition.  A prescription has been provided for fluticasone nasal spray, one spray per nostril 1-2 times daily as needed. Proper nasal spray technique has been discussed and demonstrated.  Nasal saline lavage (NeilMed) as needed has been recommended along with instructions for proper administration.    Return When lab results have returned the patient will be called with further recommendations and follow up.  Urticaria (Hives)  . Cetirizine (Zyrtec) 31m once a day.  If symptoms continue then increase to .  .Marland KitchenCetirizine (Zyrtec) 114m twice a day.  If symptoms continue then increase to .  . Marland Kitchenetirizine (Zyrtec) 1041mtwice a day and Ranitidine (Zantac) 150 mg once a day.  If symptoms continue then increase to.  . Cetirizine (Zyrtec) 102m61mwice a day and Ranitidine (Zantac) 150 mg twice a day  May use Benadryl as needed for breakthrough symptoms       If no symptoms for 7 days, then step down dosage

## 2015-06-19 NOTE — Assessment & Plan Note (Addendum)
Chronic urticaria. There is no obvious etiology identified. Skin tests to select food allergens were negative today. NSAIDs and emotional stress commonly exacerbate urticaria but are not the underlying etiology in this case. Physical urticarias are negative by history (i.e. pressure-induced, temperature, vibration, solar, etc.). History and lesions are not consistent with urticaria pigmentosa so I am not suspicious for mastocytosis. There are no concomitant symptoms concerning for anaphylaxis or constitutional symptoms worrisome for an underlying malignancy. We will rule out other potential etiologies with labs. For symptom relief, patient is to take oral antihistamines as directed.  The following labs have been ordered: FCeRI antibody, TSH, anti-thyroglobulin antibody, thyroid peroxidase antibody, C4 level, tryptase, urea breath test, CBC, CMP, ESR, ANA, and galactose-alpha-1,3-galactose IgE level.  The patient will be called with further recommendations after lab results have returned.  Instructions have been provided and discussed for H1/H2 receptor blockade with titration to find lowest effective dose.  A journal is to be kept recording any foods eaten, beverages consumed, medications taken within a 6 hour period prior to the onset of symptoms, as well as record activities being performed, and environmental conditions. For any symptoms concerning for anaphylaxis, 911 is to be called immediately.

## 2015-06-19 NOTE — Assessment & Plan Note (Signed)
Non-allergic rhinitis.  All seasonal and perennial aeroallergen skin tests are negative despite a positive histamine control.  Intranasal steroids and intranasal antihistamines are effective for symptoms associated with non-allergic rhinitis, whereas second generation antihistamines such as cetirizine, loratadine and fexofenadine have been found to be ineffective for this condition.  A prescription has been provided for fluticasone nasal spray, one spray per nostril 1-2 times daily as needed. Proper nasal spray technique has been discussed and demonstrated.  Nasal saline lavage (NeilMed) as needed has been recommended along with instructions for proper administration.

## 2015-06-19 NOTE — Assessment & Plan Note (Addendum)
Associated angioedema occurs in up to 50% of patients with chronic urticaria.  Treatment/diagnostic plan as outlined above. 

## 2015-06-19 NOTE — Progress Notes (Signed)
Follow-up Note  RE: Ved Martos MRN: [id number] DOB: 06-25-1945 Date of Office Visit: 06/19/2015  Primary care provider: Leola Brazil, MD Referring provider: Vincente Liberty, MD  History of present illness: HPI Comments: Nolberto Cheuvront is a 70 y.o. male is urticaria and rhinitis who returns today for allergy skin testing.  He was unable to undergo skin testing during his previous office visit due to recent administration of antihistamine.  He has been off of all antihistamines over the past 3 days in anticipation of today's testing.  He has managed to control the urticaria over the past week with antihistamines and took 10 mg of prednisone over the past 3 days while on the antihistamine hiatus prior to the testing.  He has no additional nasal symptom complaints today.    Assessment and plan: Urticaria Chronic urticaria. There is no obvious etiology identified. Skin tests to select food allergens were negative today. NSAIDs and emotional stress commonly exacerbate urticaria but are not the underlying etiology in this case. Physical urticarias are negative by history (i.e. pressure-induced, temperature, vibration, solar, etc.). History and lesions are not consistent with urticaria pigmentosa so I am not suspicious for mastocytosis. There are no concomitant symptoms concerning for anaphylaxis or constitutional symptoms worrisome for an underlying malignancy. We will rule out other potential etiologies with labs. For symptom relief, patient is to take oral antihistamines as directed.  The following labs have been ordered: FCeRI antibody, TSH, anti-thyroglobulin antibody, thyroid peroxidase antibody, C4 level, tryptase, urea breath test, CBC, CMP, ESR, ANA, and galactose-alpha-1,3-galactose IgE level.  The patient will be called with further recommendations after lab results have returned.  Instructions have been provided and discussed for H1/H2 receptor blockade with titration to find  lowest effective dose.  A journal is to be kept recording any foods eaten, beverages consumed, medications taken within a 6 hour period prior to the onset of symptoms, as well as record activities being performed, and environmental conditions. For any symptoms concerning for anaphylaxis, 911 is to be called immediately.  Angioedema Associated angioedema occurs in up to 50% of patients with chronic urticaria.  Treatment/diagnostic plan as outlined above.  Chronic rhinitis Non-allergic rhinitis.  All seasonal and perennial aeroallergen skin tests are negative despite a positive histamine control.  Intranasal steroids and intranasal antihistamines are effective for symptoms associated with non-allergic rhinitis, whereas second generation antihistamines such as cetirizine, loratadine and fexofenadine have been found to be ineffective for this condition.  A prescription has been provided for fluticasone nasal spray, one spray per nostril 1-2 times daily as needed. Proper nasal spray technique has been discussed and demonstrated.  Nasal saline lavage (NeilMed) as needed has been recommended along with instructions for proper administration.   Diagnositics: Environmental and food allergen skin testing: Negative despite a positive histamine control.    Physical examination: Blood pressure 110/70, pulse 80, resp. rate 20.  General: Alert, interactive, in no acute distress. HEENT: TMs pearly gray, turbinates moderately edematous without discharge, post-pharynx erythematous. Neck: Supple without lymphadenopathy. Lungs: Clear to auscultation without wheezing, rhonchi or rales. CV: Normal S1, S2 without murmurs. Skin: Warm and dry, without lesions or rashes.  The following portions of the patient's history were reviewed and updated as appropriate: allergies, current medications, past family history, past medical history, past social history, past surgical history and problem list.    Medication  List       This list is accurate as of: 06/19/15  9:58 AM.  Always use your most recent  med list.               cetirizine 10 MG tablet  Commonly known as:  ZYRTEC  Take 10 mg by mouth daily.     diphenhydrAMINE 25 MG tablet  Commonly known as:  BENADRYL  Take 1 tablet (25 mg total) by mouth every 6 (six) hours.     EPINEPHrine 0.3 mg/0.3 mL Soaj injection  Commonly known as:  EPI-PEN  Inject 0.3 mLs (0.3 mg total) into the muscle once.     fluticasone 50 MCG/ACT nasal spray  Commonly known as:  FLONASE  Place 1 spray into both nostrils 2 (two) times daily.     hydrocortisone cream 0.5 %  Apply 1 application topically at bedtime. Reported on 06/19/2015     ibuprofen 200 MG tablet  Commonly known as:  ADVIL,MOTRIN  Take 200 mg by mouth daily as needed for headache or mild pain.     predniSONE 10 MG tablet  Commonly known as:  DELTASONE  Take 10 mg by mouth daily with breakfast.     ranitidine 150 MG tablet  Commonly known as:  ZANTAC  Take 150 mg by mouth at bedtime.        Allergies  Allergen Reactions  . Other     Pt says he is not taking anything different--medication, food, not sure what he may be allergic to   Review of systems: Constitutional: Negative for fever, chills and weight loss.  HENT: Negative for nosebleeds.   Positive for nasal congestion. Cutaneous: Positive for pruritus and urticaria. Eyes: Negative for blurred vision.  Respiratory: Negative for hemoptysis.   Cardiovascular: Negative for chest pain.  Gastrointestinal: Negative for diarrhea and constipation.  Genitourinary: Negative for dysuria.  Musculoskeletal: Negative for myalgias and joint pain.  Neurological: Negative for dizziness.  Endo/Heme/Allergies: Does not bruise/bleed easily.   Past Medical History  Diagnosis Date  . Urticaria   . Arthritis     Family History  Problem Relation Age of Onset  . Diabetes Sister   . Hypertension Brother     Social History   Social  History  . Marital Status: Single    Spouse Name: N/A  . Number of Children: N/A  . Years of Education: N/A   Occupational History  . Not on file.   Social History Main Topics  . Smoking status: Current Every Day Smoker -- 0.50 packs/day    Types: Cigarettes  . Smokeless tobacco: Not on file  . Alcohol Use: Yes  . Drug Use: Yes    Special: Marijuana  . Sexual Activity: Not on file   Other Topics Concern  . Not on file   Social History Narrative    I appreciate the opportunity to take part in this Shykeem's care. Please do not hesitate to contact me with questions.  Sincerely,   R. Edgar Frisk, MD

## 2015-06-27 LAB — CBC WITH DIFFERENTIAL/PLATELET
Basophils Absolute: 0 10*3/uL (ref 0.0–0.1)
Basophils Relative: 0 % (ref 0–1)
Eosinophils Absolute: 0.2 10*3/uL (ref 0.0–0.7)
Eosinophils Relative: 4 % (ref 0–5)
HCT: 44.9 % (ref 39.0–52.0)
Hemoglobin: 15.1 g/dL (ref 13.0–17.0)
Lymphocytes Relative: 39 % (ref 12–46)
Lymphs Abs: 1.9 10*3/uL (ref 0.7–4.0)
MCH: 31.7 pg (ref 26.0–34.0)
MCHC: 33.6 g/dL (ref 30.0–36.0)
MCV: 94.3 fL (ref 78.0–100.0)
MPV: 9.3 fL (ref 8.6–12.4)
Monocytes Absolute: 0.3 10*3/uL (ref 0.1–1.0)
Monocytes Relative: 6 % (ref 3–12)
Neutro Abs: 2.4 10*3/uL (ref 1.7–7.7)
Neutrophils Relative %: 51 % (ref 43–77)
Platelets: 256 10*3/uL (ref 150–400)
RBC: 4.76 MIL/uL (ref 4.22–5.81)
RDW: 14.6 % (ref 11.5–15.5)
WBC: 4.8 10*3/uL (ref 4.0–10.5)

## 2015-06-28 LAB — C4 COMPLEMENT: C4 Complement: 24 mg/dL (ref 10–40)

## 2015-06-28 LAB — COMPREHENSIVE METABOLIC PANEL
ALT: 9 U/L (ref 9–46)
AST: 11 U/L (ref 10–35)
Albumin: 4 g/dL (ref 3.6–5.1)
Alkaline Phosphatase: 51 U/L (ref 40–115)
BUN: 14 mg/dL (ref 7–25)
CO2: 25 mmol/L (ref 20–31)
Calcium: 8.8 mg/dL (ref 8.6–10.3)
Chloride: 104 mmol/L (ref 98–110)
Creat: 1.25 mg/dL (ref 0.70–1.25)
Glucose, Bld: 84 mg/dL (ref 65–99)
Potassium: 4.2 mmol/L (ref 3.5–5.3)
Sodium: 140 mmol/L (ref 135–146)
Total Bilirubin: 0.4 mg/dL (ref 0.2–1.2)
Total Protein: 7.1 g/dL (ref 6.1–8.1)

## 2015-06-28 LAB — H. PYLORI BREATH TEST: H. pylori Breath Test: NOT DETECTED

## 2015-06-28 LAB — ANA: Anti Nuclear Antibody(ANA): NEGATIVE

## 2015-06-28 LAB — TRYPTASE: Tryptase: 9 ug/L (ref <11)

## 2015-06-28 LAB — SEDIMENTATION RATE: Sed Rate: 17 mm/hr (ref 0–20)

## 2015-07-01 LAB — GALACTOSE-ALPHA-1,3-GALACTOSE IGE: Galactose-alpha-1,3-galactose IgE: <0.1 kU/L (ref <0.35)

## 2015-07-02 ENCOUNTER — Telehealth: Payer: Self-pay

## 2015-07-02 NOTE — Telephone Encounter (Signed)
Mr. Sergio Montgomery to see if we received his lab results, I informed him it can take two weeks and the nurse/doctor will give him a call.  Please Advise   Thanks

## 2015-07-02 NOTE — Telephone Encounter (Signed)
Spoke to patient and informed him about his lab results.  He also stated that he was out of his zyrtec. I told him that samples will be up front for him.

## 2015-11-11 DIAGNOSIS — Z1211 Encounter for screening for malignant neoplasm of colon: Secondary | ICD-10-CM | POA: Diagnosis not present

## 2015-11-11 DIAGNOSIS — Z720 Tobacco use: Secondary | ICD-10-CM | POA: Diagnosis not present

## 2015-11-11 DIAGNOSIS — Z0000 Encounter for general adult medical examination without abnormal findings: Secondary | ICD-10-CM | POA: Diagnosis not present

## 2015-11-11 DIAGNOSIS — R21 Rash and other nonspecific skin eruption: Secondary | ICD-10-CM | POA: Diagnosis not present

## 2015-11-11 DIAGNOSIS — M1990 Unspecified osteoarthritis, unspecified site: Secondary | ICD-10-CM | POA: Diagnosis not present

## 2015-11-11 DIAGNOSIS — Z8739 Personal history of other diseases of the musculoskeletal system and connective tissue: Secondary | ICD-10-CM | POA: Diagnosis not present

## 2015-11-11 DIAGNOSIS — Z125 Encounter for screening for malignant neoplasm of prostate: Secondary | ICD-10-CM | POA: Diagnosis not present

## 2015-11-20 DIAGNOSIS — L299 Pruritus, unspecified: Secondary | ICD-10-CM | POA: Diagnosis not present

## 2015-11-20 DIAGNOSIS — K5900 Constipation, unspecified: Secondary | ICD-10-CM | POA: Diagnosis not present

## 2015-11-20 DIAGNOSIS — Z1211 Encounter for screening for malignant neoplasm of colon: Secondary | ICD-10-CM | POA: Diagnosis not present

## 2015-12-19 DIAGNOSIS — D122 Benign neoplasm of ascending colon: Secondary | ICD-10-CM | POA: Diagnosis not present

## 2015-12-19 DIAGNOSIS — K635 Polyp of colon: Secondary | ICD-10-CM | POA: Diagnosis not present

## 2015-12-19 DIAGNOSIS — Z1211 Encounter for screening for malignant neoplasm of colon: Secondary | ICD-10-CM | POA: Diagnosis not present

## 2015-12-19 DIAGNOSIS — D123 Benign neoplasm of transverse colon: Secondary | ICD-10-CM | POA: Diagnosis not present

## 2015-12-19 DIAGNOSIS — K5730 Diverticulosis of large intestine without perforation or abscess without bleeding: Secondary | ICD-10-CM | POA: Diagnosis not present

## 2015-12-23 DIAGNOSIS — L509 Urticaria, unspecified: Secondary | ICD-10-CM | POA: Diagnosis not present

## 2015-12-23 DIAGNOSIS — L300 Nummular dermatitis: Secondary | ICD-10-CM | POA: Diagnosis not present

## 2016-03-17 DIAGNOSIS — R5383 Other fatigue: Secondary | ICD-10-CM | POA: Diagnosis not present

## 2016-03-17 DIAGNOSIS — M150 Primary generalized (osteo)arthritis: Secondary | ICD-10-CM | POA: Diagnosis not present

## 2016-03-31 DIAGNOSIS — M109 Gout, unspecified: Secondary | ICD-10-CM | POA: Diagnosis not present

## 2016-03-31 DIAGNOSIS — Z23 Encounter for immunization: Secondary | ICD-10-CM | POA: Diagnosis not present

## 2016-03-31 DIAGNOSIS — M1A09X0 Idiopathic chronic gout, multiple sites, without tophus (tophi): Secondary | ICD-10-CM | POA: Diagnosis not present

## 2016-04-29 DIAGNOSIS — M1A09X0 Idiopathic chronic gout, multiple sites, without tophus (tophi): Secondary | ICD-10-CM | POA: Diagnosis not present

## 2016-06-24 DIAGNOSIS — M1A09X0 Idiopathic chronic gout, multiple sites, without tophus (tophi): Secondary | ICD-10-CM | POA: Diagnosis not present

## 2016-06-24 DIAGNOSIS — J22 Unspecified acute lower respiratory infection: Secondary | ICD-10-CM | POA: Diagnosis not present

## 2016-10-28 DIAGNOSIS — M1A09X0 Idiopathic chronic gout, multiple sites, without tophus (tophi): Secondary | ICD-10-CM | POA: Diagnosis not present

## 2016-10-28 DIAGNOSIS — Z13220 Encounter for screening for lipoid disorders: Secondary | ICD-10-CM | POA: Diagnosis not present

## 2020-02-16 ENCOUNTER — Emergency Department (HOSPITAL_COMMUNITY)
Admission: EM | Admit: 2020-02-16 | Discharge: 2020-02-16 | Discharge status: Against Medical Advice | Payer: Medicare HMO | Attending: Emergency Medicine | Admitting: Emergency Medicine

## 2020-02-16 ENCOUNTER — Other Ambulatory Visit: Payer: Self-pay

## 2020-02-16 DIAGNOSIS — L03116 Cellulitis of left lower limb: Secondary | ICD-10-CM | POA: Diagnosis not present

## 2020-02-16 DIAGNOSIS — L03114 Cellulitis of left upper limb: Secondary | ICD-10-CM | POA: Insufficient documentation

## 2020-02-16 DIAGNOSIS — L03119 Cellulitis of unspecified part of limb: Secondary | ICD-10-CM

## 2020-02-16 DIAGNOSIS — R000 Tachycardia, unspecified: Secondary | ICD-10-CM | POA: Diagnosis not present

## 2020-02-16 DIAGNOSIS — F17210 Nicotine dependence, cigarettes, uncomplicated: Secondary | ICD-10-CM | POA: Insufficient documentation

## 2020-02-16 DIAGNOSIS — L03115 Cellulitis of right lower limb: Secondary | ICD-10-CM | POA: Diagnosis not present

## 2020-02-16 DIAGNOSIS — R600 Localized edema: Secondary | ICD-10-CM | POA: Insufficient documentation

## 2020-02-16 LAB — URINALYSIS, ROUTINE W REFLEX MICROSCOPIC
Bilirubin Urine: NEGATIVE
Glucose, UA: NEGATIVE mg/dL
Hgb urine dipstick: NEGATIVE
Ketones, ur: 20 mg/dL — AB
Leukocytes,Ua: NEGATIVE
Nitrite: NEGATIVE
Protein, ur: NEGATIVE mg/dL
Specific Gravity, Urine: 1.013 (ref 1.005–1.030)
pH: 6 (ref 5.0–8.0)

## 2020-02-16 LAB — CBC WITH DIFFERENTIAL/PLATELET
Abs Immature Granulocytes: 0.06 10*3/uL (ref 0.00–0.07)
Basophils Absolute: 0 10*3/uL (ref 0.0–0.1)
Basophils Relative: 0 %
Eosinophils Absolute: 0 10*3/uL (ref 0.0–0.5)
Eosinophils Relative: 0 %
HCT: 36.3 % — ABNORMAL LOW (ref 39.0–52.0)
Hemoglobin: 12.2 g/dL — ABNORMAL LOW (ref 13.0–17.0)
Immature Granulocytes: 1 %
Lymphocytes Relative: 7 %
Lymphs Abs: 0.6 10*3/uL — ABNORMAL LOW (ref 0.7–4.0)
MCH: 34.9 pg — ABNORMAL HIGH (ref 26.0–34.0)
MCHC: 33.6 g/dL (ref 30.0–36.0)
MCV: 103.7 fL — ABNORMAL HIGH (ref 80.0–100.0)
Monocytes Absolute: 0.8 10*3/uL (ref 0.1–1.0)
Monocytes Relative: 9 %
Neutro Abs: 6.6 10*3/uL (ref 1.7–7.7)
Neutrophils Relative %: 83 %
Platelets: 226 10*3/uL (ref 150–400)
RBC: 3.5 MIL/uL — ABNORMAL LOW (ref 4.22–5.81)
RDW: 13.2 % (ref 11.5–15.5)
WBC: 8 10*3/uL (ref 4.0–10.5)
nRBC: 0 % (ref 0.0–0.2)

## 2020-02-16 LAB — I-STAT VENOUS BLOOD GAS, ED
Acid-Base Excess: 0 mmol/L (ref 0.0–2.0)
Bicarbonate: 25.3 mmol/L (ref 20.0–28.0)
Calcium, Ion: 1.15 mmol/L (ref 1.15–1.40)
HCT: 42 % (ref 39.0–52.0)
Hemoglobin: 14.3 g/dL (ref 13.0–17.0)
O2 Saturation: 38 %
Potassium: 3.3 mmol/L — ABNORMAL LOW (ref 3.5–5.1)
Sodium: 132 mmol/L — ABNORMAL LOW (ref 135–145)
TCO2: 26 mmol/L (ref 22–32)
pCO2, Ven: 40.7 mmHg — ABNORMAL LOW (ref 44.0–60.0)
pH, Ven: 7.401 (ref 7.250–7.430)
pO2, Ven: 22 mmHg — CL (ref 32.0–45.0)

## 2020-02-16 LAB — COMPREHENSIVE METABOLIC PANEL
ALT: 28 U/L (ref 0–44)
AST: 61 U/L — ABNORMAL HIGH (ref 15–41)
Albumin: 2.9 g/dL — ABNORMAL LOW (ref 3.5–5.0)
Alkaline Phosphatase: 61 U/L (ref 38–126)
Anion gap: 18 — ABNORMAL HIGH (ref 5–15)
BUN: 11 mg/dL (ref 8–23)
CO2: 19 mmol/L — ABNORMAL LOW (ref 22–32)
Calcium: 9.6 mg/dL (ref 8.9–10.3)
Chloride: 94 mmol/L — ABNORMAL LOW (ref 98–111)
Creatinine, Ser: 0.85 mg/dL (ref 0.61–1.24)
GFR calc non Af Amer: >60 mL/min (ref >60)
Glucose, Bld: 86 mg/dL (ref 70–99)
Potassium: 3.3 mmol/L — ABNORMAL LOW (ref 3.5–5.1)
Sodium: 131 mmol/L — ABNORMAL LOW (ref 135–145)
Total Bilirubin: 1.3 mg/dL — ABNORMAL HIGH (ref 0.3–1.2)
Total Protein: 8 g/dL (ref 6.5–8.1)

## 2020-02-16 LAB — LACTIC ACID, PLASMA: Lactic Acid, Venous: 2.4 mmol/L (ref 0.5–1.9)

## 2020-02-16 LAB — BRAIN NATRIURETIC PEPTIDE: B Natriuretic Peptide: 202.2 pg/mL — ABNORMAL HIGH (ref 0.0–100.0)

## 2020-02-16 MED ORDER — HYDROCODONE-ACETAMINOPHEN 5-325 MG PO TABS
1.0000 | ORAL_TABLET | Freq: Once | ORAL | Status: AC
  Administered 2020-02-16: 1 via ORAL
  Filled 2020-02-16: qty 1

## 2020-02-16 MED ORDER — DOXYCYCLINE HYCLATE 100 MG PO TABS
100.0000 mg | ORAL_TABLET | Freq: Once | ORAL | Status: AC
  Administered 2020-02-16: 100 mg via ORAL
  Filled 2020-02-16: qty 1

## 2020-02-16 MED ORDER — CEPHALEXIN 500 MG PO CAPS: 500.0000 mg | ORAL_CAPSULE | Freq: Four times a day (QID) | ORAL | 0 refills | Status: DC

## 2020-02-16 MED ORDER — POTASSIUM CHLORIDE CRYS ER 20 MEQ PO TBCR
40.0000 meq | EXTENDED_RELEASE_TABLET | Freq: Once | ORAL | Status: AC
  Administered 2020-02-16: 40 meq via ORAL
  Filled 2020-02-16: qty 2

## 2020-02-16 MED ORDER — DOXYCYCLINE HYCLATE 100 MG PO CAPS: 100.0000 mg | ORAL_CAPSULE | Freq: Two times a day (BID) | ORAL | 0 refills | Status: DC

## 2020-02-16 MED ORDER — CEPHALEXIN 250 MG PO CAPS
500.0000 mg | ORAL_CAPSULE | Freq: Once | ORAL | Status: AC
  Administered 2020-02-16: 500 mg via ORAL
  Filled 2020-02-16: qty 2

## 2020-02-16 NOTE — Progress Notes (Addendum)
.. °  Transition of Care [facility]) - Emergency Department Mini Assessment   Patient Details  Name: Sergio Montgomery MRN: [id number] Date of Birth: August 15, 1945  Transition of Care [facility]) CM/SW Contact:    [name], [place] Phone Number: 02/16/2020, 1:51 PM   Clinical Narrative: [facility] CSW consulted with pt about transportation needs remotely.  Pt signed [employer], retail provided by nurse.  [name] was faxed [phone].  CSW contacted [employer], contracting.  [name], TOC CM/RN assisted pt to [employer].  [name], MSW, LCSW-A Pronouns:  She, Her, [place] Dura Long ED Transitions of Care Clinical Social Worker [name] [phone]   ED Mini Assessment:    Barriers to Discharge: No Barriers Identified     Means of departure: Hospital Transport  Interventions which prevented an admission or readmission: Transportation Screening    Patient Contact and Communications        ,          Patient states their goals for this hospitalization and ongoing recovery are:: To obtain transportation to get safely home.      Admission diagnosis:  swollen feet Patient Active Problem List   Diagnosis Date Noted   Urticaria 06/11/2015   Angioedema 06/11/2015   Chronic rhinitis 06/11/2015   PCP:  Vincente Liberty, MD Pharmacy:   [place], [place] - [address] [place] [address] Phone: [phone] Fax: [phone]  [facility] [phone] - [place], [place] - [address] [place] [address] Phone: [phone] Fax: [phone]

## 2020-02-16 NOTE — ED Notes (Signed)
Discharge summary reviewed with pt by Carlis Abbott RN , case management to provide transport home per pt request.

## 2020-02-16 NOTE — Discharge Instructions (Signed)
You are leaving [place] [place].  While your leg swelling could just be a fluid problem or gout, there is concerned that this could be an infection and some of her blood work indicates that if this is the case this could be more severe than you initially thought.  It is recommended you stay and get IV antibiotics and fluids.  I am giving you oral antibiotics but this may not be enough for the severity of infection.  If at any point you change your mind or feel worse you are encouraged to call 911 or return to the ER for evaluation.

## 2020-02-16 NOTE — ED Provider Notes (Signed)
[facility] EMERGENCY DEPARTMENT Provider Note   CSN: [id number] Arrival date & time: 02/16/20  7867     History Chief Complaint  Patient presents with  . Foot Swelling    bilat    Sergio Montgomery is a 74 y.o. male.  HPI 75 year old male presents with bilateral foot pain and swelling.  Ongoing for about a week.  He often gets gout in different joints typically ibuprofen will make it go away.  However it did not seem to help much this time.  He has not had a fever.  He does not usually get bilateral symptoms like he is having today.  It kind of feels like gout but not as bad.  The pain is primarily in his distal foot near his first MTP.  No fevers.  No shortness of breath orthopnea or history of cardiac disease.  The swelling goes all the way up to his knees. No injuries.   Past Medical History:  Diagnosis Date  . Arthritis   . Urticaria     Patient Active Problem List   Diagnosis Date Noted  . Urticaria 06/11/2015  . Angioedema 06/11/2015  . Chronic rhinitis 06/11/2015    Past Surgical History:  Procedure Laterality Date  . APPENDECTOMY         Family History  Problem Relation Age of Onset  . Diabetes Sister   . Hypertension Brother     Social History   Tobacco Use  . Smoking status: Current Every Day Smoker    Packs/day: 0.50    Types: Cigarettes  Substance Use Topics  . Alcohol use: Yes  . Drug use: Yes    Types: Marijuana    Home Medications Prior to Admission medications   Medication Sig Start Date End Date Taking? Authorizing Provider  cephALEXin (KEFLEX) 500 MG capsule Take 1 capsule (500 mg total) by mouth 4 (four) times daily. 02/16/20   Sherwood Gambler, MD  cetirizine (ZYRTEC) 10 MG tablet Take 10 mg by mouth daily. 05/01/15   [provider]  diphenhydrAMINE (BENADRYL) 25 MG tablet Take 1 tablet (25 mg total) by mouth every 6 (six) hours. Patient taking differently: Take 25-50 mg by mouth at bedtime. For hives all over at  night 04/18/15   Patel-Mills, [place], PA-C  doxycycline (VIBRAMYCIN) 100 MG capsule Take 1 capsule (100 mg total) by mouth 2 (two) times daily. 02/16/20   Sherwood Gambler, MD  EPINEPHrine 0.3 mg/0.3 mL IJ SOAJ injection Inject 0.3 mLs (0.3 mg total) into the muscle once. 06/03/15   Elnora Morrison, MD  fluticasone (FLONASE) 50 MCG/ACT nasal spray Place 1 spray into both nostrils 2 (two) times daily. 06/19/15   Bobbitt, [place] Muta, MD  hydrocortisone cream 0.5 % Apply 1 application topically at bedtime. Reported on 06/19/2015    [provider]  ibuprofen (ADVIL,MOTRIN) 200 MG tablet Take 200 mg by mouth daily as needed for headache or mild pain.    [provider]  predniSONE (DELTASONE) 10 MG tablet Take 10 mg by mouth daily with breakfast. 06/15/15   [provider]  ranitidine (ZANTAC) 150 MG tablet Take 150 mg by mouth at bedtime.    [provider]    Allergies    Other  Review of Systems   Review of Systems  Constitutional: Negative for fever.  Respiratory: Negative for shortness of breath.   Cardiovascular: Positive for leg swelling. Negative for chest pain.  Musculoskeletal: Positive for arthralgias.  All other systems reviewed and are negative.  Physical Exam Updated Vital Signs BP (!) 160/93   Pulse 80   Temp 98.6 F (37 C) (Oral)   Resp (!) 21   Ht 6\' 3"  (1.905 m)   Wt 81.6 kg   SpO2 96%   BMI 22.50 kg/m   Physical Exam Vitals and nursing note reviewed.  Constitutional:      General: He is not in acute distress.    Appearance: He is well-developed. He is not ill-appearing or diaphoretic.  HENT:     Head: Normocephalic and atraumatic.     Right Ear: External ear normal.     Left Ear: External ear normal.     Nose: Nose normal.  Eyes:     General:        Right eye: No discharge.        Left eye: No discharge.  Cardiovascular:     Rate and Rhythm: Regular rhythm. Tachycardia present.     Pulses:          Dorsalis pedis pulses are  2+ on the right side and 2+ on the left side.     Heart sounds: Normal heart sounds.  Pulmonary:     Effort: Pulmonary effort is normal.     Breath sounds: Normal breath sounds. No wheezing, rhonchi or rales.  Abdominal:     General: There is no distension.  Musculoskeletal:     Cervical back: Neck supple.     Comments: Significant pitting edema in the feet bilaterally. Skin is shiny with some mild warmth and distal erythema near first toe bilaterally. Tenderness is primarily at base of first toes. Milder swelling going up legs to about mid-tibia.  Skin:    General: Skin is warm and dry.  Neurological:     Mental Status: He is alert.  Psychiatric:        Mood and Affect: Mood is not anxious.     ED Results / Procedures / Treatments   Labs (all labs ordered are listed, but only abnormal results are displayed) Labs Reviewed  COMPREHENSIVE METABOLIC PANEL - Abnormal; Notable for the following components:      Result Value   Sodium 131 (*)    Potassium 3.3 (*)    Chloride 94 (*)    CO2 19 (*)    Albumin 2.9 (*)    AST 61 (*)    Total Bilirubin 1.3 (*)    Anion gap 18 (*)    All other components within normal limits  CBC WITH DIFFERENTIAL/PLATELET - Abnormal; Notable for the following components:   RBC 3.50 (*)    Hemoglobin 12.2 (*)    HCT 36.3 (*)    MCV 103.7 (*)    MCH 34.9 (*)    Lymphs Abs 0.6 (*)    All other components within normal limits  BRAIN NATRIURETIC PEPTIDE - Abnormal; Notable for the following components:   B Natriuretic Peptide 202.2 (*)    All other components within normal limits  LACTIC ACID, PLASMA - Abnormal; Notable for the following components:   Lactic Acid, Venous 2.4 (*)    All other components within normal limits  URINALYSIS, ROUTINE W REFLEX MICROSCOPIC - Abnormal; Notable for the following components:   Color, Urine AMBER (*)    Ketones, ur 20 (*)    All other components within normal limits  I-STAT VENOUS BLOOD GAS, ED - Abnormal;  Notable for the following components:   pCO2, Ven 40.7 (*)    pO2, Ven 22.0 (*)  Sodium 132 (*)    Potassium 3.3 (*)    All other components within normal limits  LACTIC ACID, PLASMA    EKG EKG Interpretation  Date/Time:  Friday February 16 2020 08:07:05 EDT Ventricular Rate:  112 PR Interval:    QRS Duration: 89 QT Interval:  366 QTC Calculation: 500 R Axis:   62 Text Interpretation: Sinus tachycardia Borderline prolonged QT interval Artifact in lead(s) I III aVL V1 V2 Confirmed by Sherwood Gambler [phone]) on 02/16/2020 8:10:58 AM   Radiology No results found.  Procedures Procedures (including critical care time)  Medications Ordered in ED Medications  HYDROcodone-acetaminophen (NORCO/VICODIN) 5-325 MG per tablet 1 tablet (1 tablet Oral Given 02/16/20 0913)  potassium chloride SA (KLOR-CON) CR tablet 40 mEq (40 mEq Oral Given 02/16/20 1103)  cephALEXin (KEFLEX) capsule 500 mg (500 mg Oral Given 02/16/20 1256)  doxycycline (VIBRA-TABS) tablet 100 mg (100 mg Oral Given 02/16/20 1256)    ED Course  I have reviewed the triage vital signs and the nursing notes.  Pertinent labs & imaging results that were available during my care of the patient were reviewed by me and considered in my medical decision making (see chart for details).    MDM Rules/Calculators/A&P                          Differential for patient's bilateral lower extremity swelling and pain includes peripheral edema, CHF, cellulitis, gout.  Bilateral DVT seems unlikely.  There are no chest symptoms so it is less likely CHF or at least not significant CHF.  On reexamination his left foot does appear more red than the right.  His initial blood work shows normal WBC but has an anion gap acidosis.  Given this, VBG was obtained but is benign and then lactate was obtained and is mildly elevated at 2.4.  The patient is resting comfortably though his heart rate is 99.  He is in no distress.  He feels well and wants to go  home.  I discussed that given this could be infectious and if that is the case then he would be in severe sepsis, I recommended IV antibiotics and admission for supportive care.  However he specifically declines.  He seems understand that he could get worse and that this could become life-threatening.  Despite this, he still wants to go.  Offered bolus of fluids and IV antibiotics but he just wants to do oral antibiotics and leave.  He will be given oral antibiotics and made aware he can return at any time. Final Clinical Impression(s) / ED Diagnoses Final diagnoses:  Cellulitis of lower extremity, unspecified laterality    Rx / DC Orders ED Discharge Orders         Ordered    doxycycline (VIBRAMYCIN) 100 MG capsule  2 times daily        02/16/20 1243    cephALEXin (KEFLEX) 500 MG capsule  4 times daily        02/16/20 1243           Sherwood Gambler, MD 02/16/20 1315

## 2020-02-16 NOTE — ED Notes (Signed)
Case management consulted for discharge ride home

## 2020-02-16 NOTE — ED Notes (Signed)
Date and time results received: 02/16/20)  Test: Lactic Acid Critical Value: 2.4  Name of Provider Notified: Regenia Skeeter MD  Order

## 2020-02-16 NOTE — ED Triage Notes (Addendum)
Pt to ED via POV from home, reports bilat foot swelling that started about 5 days ago, hx of gout. Denies any other medical hx.  Difficulty bearing weight and ambulation d/t bilat foot  pain.

## 2020-02-24 ENCOUNTER — Encounter (HOSPITAL_COMMUNITY): Payer: Self-pay | Admitting: Emergency Medicine

## 2020-02-24 ENCOUNTER — Other Ambulatory Visit: Payer: Self-pay

## 2020-02-24 ENCOUNTER — Emergency Department (HOSPITAL_COMMUNITY)
Admission: EM | Admit: 2020-02-24 | Discharge: 2020-02-25 | Disposition: A | Discharge status: Home / Self Care | Payer: Medicare HMO | Attending: Emergency Medicine | Admitting: Emergency Medicine

## 2020-02-24 DIAGNOSIS — M109 Gout, unspecified: Secondary | ICD-10-CM | POA: Diagnosis not present

## 2020-02-24 DIAGNOSIS — F17210 Nicotine dependence, cigarettes, uncomplicated: Secondary | ICD-10-CM | POA: Insufficient documentation

## 2020-02-24 DIAGNOSIS — M25562 Pain in left knee: Secondary | ICD-10-CM | POA: Diagnosis present

## 2020-02-24 DIAGNOSIS — R600 Localized edema: Secondary | ICD-10-CM | POA: Insufficient documentation

## 2020-02-24 HISTORY — DX: Gout, unspecified: M10.9

## 2020-02-24 NOTE — ED Triage Notes (Signed)
Pt reports chronic "gout" pain to bilateral feet and L wrist.  States it moves around.  Also reports lower back pain.  No known injury.  Denies urinary complaint.

## 2020-02-25 ENCOUNTER — Emergency Department (HOSPITAL_BASED_OUTPATIENT_CLINIC_OR_DEPARTMENT_OTHER): Payer: Medicare HMO

## 2020-02-25 DIAGNOSIS — M7989 Other specified soft tissue disorders: Secondary | ICD-10-CM

## 2020-02-25 DIAGNOSIS — M79672 Pain in left foot: Secondary | ICD-10-CM | POA: Diagnosis not present

## 2020-02-25 DIAGNOSIS — M79609 Pain in unspecified limb: Secondary | ICD-10-CM | POA: Diagnosis not present

## 2020-02-25 DIAGNOSIS — M25562 Pain in left knee: Secondary | ICD-10-CM

## 2020-02-25 DIAGNOSIS — M109 Gout, unspecified: Secondary | ICD-10-CM | POA: Diagnosis not present

## 2020-02-25 LAB — CBC WITH DIFFERENTIAL/PLATELET
Abs Immature Granulocytes: 0.08 10*3/uL — ABNORMAL HIGH (ref 0.00–0.07)
Basophils Absolute: 0 10*3/uL (ref 0.0–0.1)
Basophils Relative: 0 %
Eosinophils Absolute: 0.1 10*3/uL (ref 0.0–0.5)
Eosinophils Relative: 1 %
HCT: 36.3 % — ABNORMAL LOW (ref 39.0–52.0)
Hemoglobin: 12.2 g/dL — ABNORMAL LOW (ref 13.0–17.0)
Immature Granulocytes: 1 %
Lymphocytes Relative: 7 %
Lymphs Abs: 0.9 10*3/uL (ref 0.7–4.0)
MCH: 33.6 pg (ref 26.0–34.0)
MCHC: 33.6 g/dL (ref 30.0–36.0)
MCV: 100 fL (ref 80.0–100.0)
Monocytes Absolute: 0.9 10*3/uL (ref 0.1–1.0)
Monocytes Relative: 8 %
Neutro Abs: 10 10*3/uL — ABNORMAL HIGH (ref 1.7–7.7)
Neutrophils Relative %: 83 %
Platelets: 507 10*3/uL — ABNORMAL HIGH (ref 150–400)
RBC: 3.63 MIL/uL — ABNORMAL LOW (ref 4.22–5.81)
RDW: 13.5 % (ref 11.5–15.5)
WBC: 12 10*3/uL — ABNORMAL HIGH (ref 4.0–10.5)
nRBC: 0 % (ref 0.0–0.2)

## 2020-02-25 LAB — BASIC METABOLIC PANEL
Anion gap: 13 (ref 5–15)
BUN: 16 mg/dL (ref 8–23)
CO2: 24 mmol/L (ref 22–32)
Calcium: 9.5 mg/dL (ref 8.9–10.3)
Chloride: 94 mmol/L — ABNORMAL LOW (ref 98–111)
Creatinine, Ser: 0.97 mg/dL (ref 0.61–1.24)
GFR, Estimated: >60 mL/min (ref >60)
Glucose, Bld: 118 mg/dL — ABNORMAL HIGH (ref 70–99)
Potassium: 3.6 mmol/L (ref 3.5–5.1)
Sodium: 131 mmol/L — ABNORMAL LOW (ref 135–145)

## 2020-02-25 MED ORDER — OXYCODONE-ACETAMINOPHEN 5-325 MG PO TABS: 1.0000 | ORAL_TABLET | Freq: Three times a day (TID) | ORAL | 0 refills | Status: DC | PRN

## 2020-02-25 MED ORDER — OXYCODONE-ACETAMINOPHEN 5-325 MG PO TABS
1.0000 | ORAL_TABLET | Freq: Once | ORAL | Status: AC
  Administered 2020-02-25: 1 via ORAL
  Filled 2020-02-25: qty 1

## 2020-02-25 MED ORDER — PREDNISONE 20 MG PO TABS
60.0000 mg | ORAL_TABLET | Freq: Once | ORAL | Status: AC
  Administered 2020-02-25: 60 mg via ORAL
  Filled 2020-02-25: qty 3

## 2020-02-25 MED ORDER — PREDNISONE 50 MG PO TABS: ORAL_TABLET | ORAL | 0 refills | Status: DC

## 2020-02-25 NOTE — Discharge Instructions (Signed)
No blood clots today in your leg and your lab work looks good with your kidneys and electroyltes

## 2020-02-25 NOTE — ED Notes (Signed)
ED Provider at bedside. 

## 2020-02-25 NOTE — ED Notes (Signed)
Patient transported to Ultrasound 

## 2020-02-25 NOTE — ED Notes (Signed)
Patient is not in assigned room

## 2020-02-25 NOTE — ED Provider Notes (Signed)
Pt DVT scan is neg.  CBC with minimal leukocytosis of 12 but normal hb and BMP without acute findings.  Pt d/ced home with gout treatment   Blanchie Dessert, MD 02/25/20 [phone]

## 2020-02-25 NOTE — ED Provider Notes (Signed)
[facility] Provider Note   CSN: [id number] Arrival date & time: 02/24/20  1531     History Chief Complaint  Patient presents with  . Gout  . Back Pain    Sergio Montgomery. is a 74 y.o. male.  The history is provided by the patient.  Leg Pain Location:  Knee and foot Injury: no   Knee location:  L knee Foot location:  L foot Pain details:    Quality:  Aching   Radiates to:  Does not radiate   Severity:  Moderate   Onset quality:  Gradual   Timing:  Intermittent   Progression:  Worsening Chronicity:  Recurrent Relieved by:  Nothing Exacerbated by: Movement. Associated symptoms: back pain   Associated symptoms: no fever   Patient with history of gout presents with joint pain.  He reports he has had these flareups before.  His pain now includes his left hand, left knee, left foot.  No recent falls or trauma.  He was recently seen earlier this month for leg pain and swelling and was felt to have cellulitis.  He was given Keflex and doxycycline and has completed most of this course reports much of his symptoms have improved.  However he reports continued pain and swelling of the left leg.  Denies fever/vomiting/chest pain or shortness of breath.  No previous history of VTE. He reports he is currently seeking a new primary physician     Past Medical History:  Diagnosis Date  . Arthritis   . Gout   . Urticaria     Patient Active Problem List   Diagnosis Date Noted  . Urticaria 06/11/2015  . Angioedema 06/11/2015  . Chronic rhinitis 06/11/2015    Past Surgical History:  Procedure Laterality Date  . APPENDECTOMY         Family History  Problem Relation Age of Onset  . Diabetes Sister   . Hypertension Brother     Social History   Tobacco Use  . Smoking status: Current Every Day Smoker    Packs/day: 0.50    Types: Cigarettes  Substance Use Topics  . Alcohol use: Yes  . Drug use: Yes    Types: Marijuana    Home  Medications Prior to Admission medications   Medication Sig Start Date End Date Taking? Authorizing Provider  cephALEXin (KEFLEX) 500 MG capsule Take 1 capsule (500 mg total) by mouth 4 (four) times daily. 02/16/20   Sherwood Gambler, MD  cetirizine (ZYRTEC) 10 MG tablet Take 10 mg by mouth daily. 05/01/15   [provider]  diphenhydrAMINE (BENADRYL) 25 MG tablet Take 1 tablet (25 mg total) by mouth every 6 (six) hours. Patient taking differently: Take 25-50 mg by mouth at bedtime. For hives all over at night 04/18/15   Patel-Mills, [place], PA-C  doxycycline (VIBRAMYCIN) 100 MG capsule Take 1 capsule (100 mg total) by mouth 2 (two) times daily. 02/16/20   Sherwood Gambler, MD  EPINEPHrine 0.3 mg/0.3 mL IJ SOAJ injection Inject 0.3 mLs (0.3 mg total) into the muscle once. 06/03/15   Elnora Morrison, MD  fluticasone (FLONASE) 50 MCG/ACT nasal spray Place 1 spray into both nostrils 2 (two) times daily. 06/19/15   Bobbitt, [place] Muta, MD  hydrocortisone cream 0.5 % Apply 1 application topically at bedtime. Reported on 06/19/2015    [provider]  ibuprofen (ADVIL,MOTRIN) 200 MG tablet Take 200 mg by mouth daily as needed for headache or mild pain.    [provider]  oxyCODONE-acetaminophen (PERCOCET) 5-325 MG tablet Take 1 tablet by mouth every 8 (eight) hours as needed for severe pain. 02/25/20   Ripley Fraise, MD  predniSONE (DELTASONE) 50 MG tablet One tablet po daily for 5 days 02/25/20   Ripley Fraise, MD  ranitidine (ZANTAC) 150 MG tablet Take 150 mg by mouth at bedtime.    [provider]    Allergies    Other  Review of Systems   Review of Systems  Constitutional: Negative for fever.  Respiratory: Negative for shortness of breath.   Cardiovascular: Negative for chest pain.  Gastrointestinal: Negative for vomiting.  Musculoskeletal: Positive for arthralgias and back pain.  All other systems reviewed and are negative.   Physical Exam Updated Vital  Signs BP (!) 149/95   Pulse 98   Temp 98.5 F (36.9 C) (Oral)   Resp 18   Ht 1.905 m (6\' 3" )   Wt 81.6 kg   SpO2 99%   BMI 22.50 kg/m   Physical Exam CONSTITUTIONAL: Elderly, no acute distress HEAD: Normocephalic/atraumatic EYES: EOMI/PERRL ENMT: Mucous membranes moist NECK: supple no meningeal signs SPINE/BACK:entire spine nontender CV: S1/S2 noted, no murmurs/rubs/gallops noted LUNGS: Lungs are clear to auscultation bilaterally, no apparent distress ABDOMEN: soft, nontender NEURO: Pt is awake/alert/appropriate, moves all extremitiesx4.  No facial droop.   EXTREMITIES: pulses normal/equal, full ROM, tenderness noted to dorsal aspect of left hand.  Localized erythema noted.  Tenderness to palpation of left knee.  Tenderness to palpation of left foot with mild erythema noted.  There are no wounds of the plantar surface.  No crepitus noted to his extremities. There is no pain or edema noted to the right leg Full range of motion of both legs, all other extremities/joints palpated/ranged and nontender No significant calf tenderness SKIN: warm, color normal PSYCH: no abnormalities of mood noted, alert and oriented to situation      Patient gave verbal permission to utilize photo for medical documentation only The image was not stored on any personal device  ED Results / Procedures / Treatments   Labs (all labs ordered are listed, but only abnormal results are displayed) Labs Reviewed - No data to display  EKG None  Radiology No results found.  Procedures Procedures  Medications Ordered in ED Medications  predniSONE (DELTASONE) tablet 60 mg (60 mg Oral Given 02/25/20 0622)  oxyCODONE-acetaminophen (PERCOCET/ROXICET) 5-325 MG per tablet 1 tablet (1 tablet Oral Given 02/25/20 0109)    ED Course  I have reviewed the triage vital signs and the nursing notes.      MDM Rules/Calculators/A&P                          6:52 AM Patient is an elderly gentleman who has  a history of gouty arthritis in the past.  He reports return of pain in his left hand/wrist, left knee and left foot.  He was seen earlier this month and was felt to have a cellulitis in his legs and has finished most of those antibiotics.  When I reviewed that chart, it appears the issue in his right leg is improved.  However he has continued pain and swelling to left leg.  Strong suspicion these issues indicate gouty arthritis However due to swelling in the left leg, will obtain DVT ultrasound to ensure there is no underlying DVT.  Labs have been ordered 7:03 AM Signed out to Dr. Maryan Rued with labs/DVT study pending  Final Clinical Impression(s) /  ED Diagnoses Final diagnoses:  Lower extremity edema  Gouty arthritis    Rx / DC Orders ED Discharge Orders         Ordered    predniSONE (DELTASONE) 50 MG tablet        02/25/20 0616    oxyCODONE-acetaminophen (PERCOCET) 5-325 MG tablet  Every 8 hours PRN        02/25/20 4159           Ripley Fraise, MD 02/25/20 [phone]

## 2020-02-25 NOTE — Progress Notes (Signed)
VASCULAR LAB    Left lower extremity venous duplex has been performed.  See CV proc for preliminary results.   Ege Muckey, RVT 02/25/2020, 8:42 AM

## 2020-03-05 ENCOUNTER — Emergency Department (HOSPITAL_COMMUNITY)
Admission: EM | Admit: 2020-03-05 | Discharge: 2020-03-05 | Disposition: A | Discharge status: Home / Self Care | Payer: Medicare HMO | Attending: Emergency Medicine | Admitting: Emergency Medicine

## 2020-03-05 ENCOUNTER — Emergency Department (HOSPITAL_COMMUNITY): Payer: Medicare HMO

## 2020-03-05 ENCOUNTER — Encounter (HOSPITAL_COMMUNITY): Payer: Self-pay | Admitting: Emergency Medicine

## 2020-03-05 DIAGNOSIS — M545 Low back pain, unspecified: Secondary | ICD-10-CM | POA: Diagnosis not present

## 2020-03-05 DIAGNOSIS — F17210 Nicotine dependence, cigarettes, uncomplicated: Secondary | ICD-10-CM | POA: Diagnosis not present

## 2020-03-05 DIAGNOSIS — M5416 Radiculopathy, lumbar region: Secondary | ICD-10-CM | POA: Insufficient documentation

## 2020-03-05 DIAGNOSIS — R609 Edema, unspecified: Secondary | ICD-10-CM | POA: Diagnosis not present

## 2020-03-05 DIAGNOSIS — R52 Pain, unspecified: Secondary | ICD-10-CM | POA: Diagnosis not present

## 2020-03-05 DIAGNOSIS — M5459 Other low back pain: Secondary | ICD-10-CM | POA: Diagnosis present

## 2020-03-05 LAB — BASIC METABOLIC PANEL
Anion gap: 11 (ref 5–15)
BUN: 8 mg/dL (ref 8–23)
CO2: 24 mmol/L (ref 22–32)
Calcium: 9.3 mg/dL (ref 8.9–10.3)
Chloride: 102 mmol/L (ref 98–111)
Creatinine, Ser: 1.03 mg/dL (ref 0.61–1.24)
GFR, Estimated: >60 mL/min (ref >60)
Glucose, Bld: 95 mg/dL (ref 70–99)
Potassium: 4.1 mmol/L (ref 3.5–5.1)
Sodium: 137 mmol/L (ref 135–145)

## 2020-03-05 LAB — CBC
HCT: 39.9 % (ref 39.0–52.0)
Hemoglobin: 13.1 g/dL (ref 13.0–17.0)
MCH: 33.3 pg (ref 26.0–34.0)
MCHC: 32.8 g/dL (ref 30.0–36.0)
MCV: 101.5 fL — ABNORMAL HIGH (ref 80.0–100.0)
Platelets: 438 10*3/uL — ABNORMAL HIGH (ref 150–400)
RBC: 3.93 MIL/uL — ABNORMAL LOW (ref 4.22–5.81)
RDW: 13.9 % (ref 11.5–15.5)
WBC: 7.8 10*3/uL (ref 4.0–10.5)
nRBC: 0 % (ref 0.0–0.2)

## 2020-03-05 MED ORDER — PREDNISONE 10 MG PO TABS: 10.0000 mg | ORAL_TABLET | ORAL | 0 refills | Status: DC

## 2020-03-05 MED ORDER — LIDOCAINE 5 % EX PTCH
1.0000 | MEDICATED_PATCH | CUTANEOUS | Status: DC
  Administered 2020-03-05: 1 via TRANSDERMAL
  Filled 2020-03-05: qty 1

## 2020-03-05 MED ORDER — DICLOFENAC SODIUM 1 % EX GEL: 2.0000 g | Freq: Four times a day (QID) | CUTANEOUS | 0 refills | Status: DC | PRN

## 2020-03-05 MED ORDER — OXYCODONE-ACETAMINOPHEN 5-325 MG PO TABS
1.0000 | ORAL_TABLET | Freq: Once | ORAL | Status: AC
  Administered 2020-03-05: 1 via ORAL
  Filled 2020-03-05: qty 1

## 2020-03-05 NOTE — ED Provider Notes (Signed)
[facility] EMERGENCY DEPARTMENT Provider Note   CSN: [id number] Arrival date & time: 03/05/20  1049     History Chief Complaint  Patient presents with  . Leg Swelling    Sergio Montgomery. is a 74 y.o. male who presents with 2 weeks of left hip and leg pain.  He states that the pain originates in his posterior left hip and low back, and radiates down the back of his leg.  He states that he has now become so painful and he can no longer walk on it.  He endorses intermittent numbness in the base of his left foot.  He states that his pain is worse with movement.  I personally reviewed this patient's medical records.  He has been seen twice in the emergency department for the same issue over the last few weeks.  On 02/16/2020 he was felt to have cellulitis, states he completed antibiotic therapy with some improvement of symptoms, without resolution.  On 02/24/2020 patient was seen again for the same issue and thought to have gout, complete a course of steroids.  He had a negative DVT study that day.   He presents today with frustration states that he has had gout in the past, but voices that this pain feels different.  His gout has never lasted more than 2 days and usually resolves with ibuprofen.  He states that he is taking ibuprofen at home and it has helped some pain in his foot, but has not improved pain in his back or his leg.  I personally reviewed this patient's medical records.  He has history of angioedema, chronic rhinitis, but is otherwise does not to carry any medical diagnoses and is not on any medications every day.  Patient denies numbness or tingling in his groin, difficulty urinating, sensation of urinary retention, urinary or fecal incontinence.  He does endorse urinary urgency.  HPI     Past Medical History:  Diagnosis Date  . Arthritis   . Gout   . Urticaria     Patient Active Problem List   Diagnosis Date Noted  . Urticaria 06/11/2015  .  Angioedema 06/11/2015  . Chronic rhinitis 06/11/2015    Past Surgical History:  Procedure Laterality Date  . APPENDECTOMY         Family History  Problem Relation Age of Onset  . Diabetes Sister   . Hypertension Brother     Social History   Tobacco Use  . Smoking status: Current Every Day Smoker    Packs/day: 0.50    Types: Cigarettes  Substance Use Topics  . Alcohol use: Yes  . Drug use: Yes    Types: Marijuana    Home Medications Prior to Admission medications   Medication Sig Start Date End Date Taking? Authorizing Provider  cephALEXin (KEFLEX) 500 MG capsule Take 1 capsule (500 mg total) by mouth 4 (four) times daily. 02/16/20   Sherwood Gambler, MD  cetirizine (ZYRTEC) 10 MG tablet Take 10 mg by mouth daily. 05/01/15   [provider]  diclofenac Sodium (VOLTAREN) 1 % GEL Apply 2 g topically 4 (four) times daily as needed. 03/05/20   Lajoy Vanamburg, Gypsy Balsam, PA-C  diphenhydrAMINE (BENADRYL) 25 MG tablet Take 1 tablet (25 mg total) by mouth every 6 (six) hours. Patient taking differently: Take 25-50 mg by mouth at bedtime. For hives all over at night 04/18/15   Patel-Mills, [place], PA-C  doxycycline (VIBRAMYCIN) 100 MG capsule Take 1 capsule (100 mg total) by mouth  2 (two) times daily. 02/16/20   Sherwood Gambler, MD  EPINEPHrine 0.3 mg/0.3 mL IJ SOAJ injection Inject 0.3 mLs (0.3 mg total) into the muscle once. 06/03/15   Elnora Morrison, MD  fluticasone (FLONASE) 50 MCG/ACT nasal spray Place 1 spray into both nostrils 2 (two) times daily. 06/19/15   Bobbitt, [place] Muta, MD  hydrocortisone cream 0.5 % Apply 1 application topically at bedtime. Reported on 06/19/2015    [provider]  ibuprofen (ADVIL,MOTRIN) 200 MG tablet Take 200 mg by mouth daily as needed for headache or mild pain.    [provider]  oxyCODONE-acetaminophen (PERCOCET) 5-325 MG tablet Take 1 tablet by mouth every 8 (eight) hours as needed for severe pain. 02/25/20   Ripley Fraise,  MD  predniSONE (DELTASONE) 10 MG tablet Take 1 tablet (10 mg total) by mouth See admin instructions. Take 4 tablets (40 mg) daily for 3 days, then take 3 tablets (30 mg) daily for 3 days, then take 2 tablets (20 mg) daily for 3 days, then take 1 tablet (10 mg) daily time 5 days, for 14 days total symptoms. 03/05/20   Berenis Corter, Gypsy Balsam, PA-C  ranitidine (ZANTAC) 150 MG tablet Take 150 mg by mouth at bedtime.    [provider]    Allergies    Other  Review of Systems   Review of Systems  Constitutional: Negative.  Negative for chills, fatigue and fever.  Respiratory: Negative for chest tightness and shortness of breath.   Cardiovascular: Negative for chest pain, palpitations and leg swelling.  Gastrointestinal: Negative for abdominal pain, diarrhea, nausea and vomiting.  Genitourinary: Positive for urgency. Negative for decreased urine volume, difficulty urinating, dysuria, enuresis, frequency and penile pain.  Musculoskeletal: Positive for back pain, gait problem and myalgias.  Skin: Negative.   Allergic/Immunologic: Negative.   Neurological: Negative for dizziness, syncope, weakness, light-headedness and headaches.  Psychiatric/Behavioral: Negative.     Physical Exam Updated Vital Signs BP (!) 135/100 (BP Location: Right Arm)   Pulse (!) 108   Temp 98.6 F (37 C) (Oral)   Resp 14   Ht 6\' 3"  (1.905 m)   Wt 79.4 kg   SpO2 100%   BMI 21.87 kg/m   Physical Exam Vitals and nursing note reviewed.  Constitutional:      Appearance: He is not ill-appearing.  HENT:     Head: Normocephalic and atraumatic.     Mouth/Throat:     Mouth: Mucous membranes are moist.     Pharynx: No oropharyngeal exudate or posterior oropharyngeal erythema.  Eyes:     General:        Right eye: No discharge.        Left eye: No discharge.     Conjunctiva/sclera: Conjunctivae normal.  Cardiovascular:     Rate and Rhythm: Normal rate and regular rhythm.     Pulses:          Dorsalis  pedis pulses are 1+ on the right side and 1+ on the left side.  Pulmonary:     Effort: Pulmonary effort is normal. No respiratory distress.     Breath sounds: Normal breath sounds. No wheezing or rales.  Abdominal:     General: There is no distension.     Palpations: Abdomen is soft.     Tenderness: There is no abdominal tenderness.  Musculoskeletal:        General: Tenderness present. No deformity.     Cervical back: Normal and neck supple.     Thoracic  back: Normal.     Lumbar back: Spasms and tenderness present. No bony tenderness. Positive left straight leg raise test.     Right knee: Normal.     Left knee: Tenderness present.     Right lower leg: No edema.     Left lower leg: No edema.     Right foot: Normal. No deformity.     Left foot: Tenderness present. No deformity.       Legs:     Comments: Left knee without signs of erythema, swelling.  These are symmetric in shape and size.  Skin:    General: Skin is warm and dry.     Capillary Refill: Capillary refill takes less than 2 seconds.  Neurological:     General: No focal deficit present.     Mental Status: He is alert and oriented to person, place, and time.  Psychiatric:        Mood and Affect: Mood normal.     ED Results / Procedures / Treatments   Labs (all labs ordered are listed, but only abnormal results are displayed) Labs Reviewed  CBC - Abnormal; Notable for the following components:      Result Value   RBC 3.93 (*)    MCV 101.5 (*)    Platelets 438 (*)    All other components within normal limits  BASIC METABOLIC PANEL    EKG None  Radiology MR Lumbar Spine Wo Contrast  Result Date: 03/05/2020 CLINICAL DATA:  Low back pain. EXAM: MRI LUMBAR SPINE WITHOUT CONTRAST TECHNIQUE: Multiplanar, multisequence MR imaging of the lumbar spine was performed. No intravenous contrast was administered. COMPARISON:  MRI 12/16/2003. FINDINGS: Segmentation: There are five lumbar type vertebral bodies. The last full  intervertebral disc space is labeled L5-S1. This correlates with the prior study. Alignment: Normal overall alignment. Slight degenerative anterolisthesis of L4. Vertebrae: Patchy E heterogeneous T1 marrow signal likely due to smoking, anemia or osteoporosis. Slightly progressive since the prior MRI. No significant abnormal STIR signal intensity or discrete bone lesions. Stable endplate reactive changes at L5-S1. Conus medullaris and cauda equina: Conus extends to the L1-2 level. Conus and cauda equina appear normal. Paraspinal and other soft tissues: Bilateral renal cysts are noted. No retroperitoneal mass or adenopathy. The aorta is normal in caliber. Disc levels: T12-L1: No significant findings. L1-2: No significant findings. L2-3: Mild annular bulge. No spinal, lateral recess or foraminal stenosis. L3-4: Mild annular bulge and moderate facet disease with mild lateral recess encroachment bilaterally. No focal disc protrusion, significant spinal or foraminal stenosis. L4-5: Bulging slightly uncovered disc and moderate to advanced facet disease. There is slight flattening of the ventral thecal sac the spinal canal is generous in is no significant spinal, lateral recess or foraminal stenosis. L5-S1: Moderate degenerative disc disease but no disc protrusions, spinal or lateral recess stenosis. Mild right foraminal stenosis due to osteophytic spurring right-sided facet disease. This appears stable to slightly improved when compared to the prior study. IMPRESSION: 1. Mild lateral recess encroachment bilaterally at L3-4. 2. 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 Electronically Signed   By: Marijo Sanes M.D.   On: 03/05/2020 14:11    Procedures Procedures (including critical care time)  Medications Ordered in ED Medications  lidocaine (LIDODERM) 5 % 1 patch (1 patch Transdermal Patch Applied 03/05/20 1535)  oxyCODONE-acetaminophen (PERCOCET/ROXICET) 5-325 MG per tablet 1 tablet (1 tablet  Oral Given 03/05/20 1534)    ED Course  I have reviewed the triage vital signs  and the nursing notes.  Pertinent labs & imaging results that were available during my care of the patient were reviewed by me and considered in my medical decision making (see chart for details).  Clinical Course as of Mar 06 1547  Tue Mar 05, 2020  1221 WBC: 7.8 [RS]    Clinical Course User Index [RS] Laruen Risser, Gypsy Balsam, PA-C   MDM Rules/Calculators/A&P                         Differential diagnosis for this patient's symptoms include but not limited to lumbar radiculopathy, sciatica, cord compression, cauda equina syndrome, lumbar fracture/dislocation, spinal stenosis, osteomyelitis, acute muscle strain, degenerative joint disease.   Vital signs reassuring on intake.  Patient is mildly hypertensive 141/94, afebrile, normal heart rate  CBC without leukocytosis, BMP unremarkable.  Physical exam significant for tenderness palpation of the left PSIS with pain that radiates on the back of the leg, pain the left popliteal fossa. Patient's legs are neurovascularly intact bilaterally.  Palpable DP pulses.  Given this is patient's third visit for the same concern, physical exam suggestive of radicular pain, no sign of infectious etiology, we will proceed with MRI of the lumbar spine.   MRI of the lumbar spine with mild lateral recess encroachment bilaterally at L3-L4.  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  Reassuring physical exam, laboratory studies, and MRI - do not feel any further work-up is necessary in the emergency department at this time.  Patient symptoms are consistent with lumbar radiculopathy.   Care of this patient was discussed with attending physician Dr. Sabra Heck. Will discharge home with course of steroids, Voltaren gel.  Recommend patient follow-up with PCP/orthopedics for further evaluation and management.  His vital signs have remained stable throughout his stay  in the emergency department.  Patient voiced understanding of his evaluation and treatment plan, each of his questions were answered to his expressed satisfaction.  Strict return precautions were given.  Patient stable for discharge at this time.   Final Clinical Impression(s) / ED Diagnoses Final diagnoses:  Lumbar radiculopathy    Rx / DC Orders ED Discharge Orders         Ordered    predniSONE (DELTASONE) 10 MG tablet  See admin instructions,   Status:  Discontinued        03/05/20 1515    diclofenac Sodium (VOLTAREN) 1 % GEL  4 times daily PRN,   Status:  Discontinued        03/05/20 1515    predniSONE (DELTASONE) 10 MG tablet  See admin instructions        03/05/20 1547    diclofenac Sodium (VOLTAREN) 1 % GEL  4 times daily PRN        03/05/20 1547           Viet Kemmerer, Gypsy Balsam, PA-C 03/05/20 1548    Noemi Chapel, MD 03/07/20 1525

## 2020-03-05 NOTE — ED Triage Notes (Signed)
Pt arrives via EMS from home with left knee and leg swelling. Seen recently here for gout and finished round of prednisone. Knee is tender to touch.

## 2020-03-05 NOTE — Discharge Instructions (Addendum)
You were seen and evaluated in the emergency department today for evaluation of your low back pain.  The MRI of your low back revealed some degenerative, age-related changes, but did not reveal any damage to your spinal cord or cause for immediate concern.  You have been prescribed 14 days worth of steroids to take at home.  This should decrease inflammation around the nerve that is irritated in your lower back.  Additionally I have prescribed you topical gel you may apply to the area as needed for pain.  You may utilize over-the-counter Tylenol, ibuprofen as needed for pain as well.  You may use over-the-counter lidocaine patches as needed, but please do NOT use these in combination with the gel I have prescribed you.  Below is contact information for Dr. Marcelino Scot, the orthopedic specialist.  Please call and schedule appointment with his office for follow-up, and further management of your low back pain.    Attached is also the information for [facility], which is the primary care clinic associated with [facility].  Additionally there is a phone number the end of this paperwork that you may call to speak with someone over the phone will assist you in establishing care with a primary care provider.   Please return to the emergency department if develop any new numbness or tingling in your legs, new weakness in the legs, difficulty urinating, incontinence, numbness or tingling in your groin, or other new severe symptoms.

## 2020-03-18 ENCOUNTER — Other Ambulatory Visit: Payer: Self-pay

## 2020-03-18 ENCOUNTER — Encounter (INDEPENDENT_AMBULATORY_CARE_PROVIDER_SITE_OTHER): Payer: Self-pay | Admitting: Primary Care

## 2020-03-18 ENCOUNTER — Ambulatory Visit (INDEPENDENT_AMBULATORY_CARE_PROVIDER_SITE_OTHER): Payer: Medicare HMO | Admitting: Primary Care

## 2020-03-18 DIAGNOSIS — M545 Low back pain, unspecified: Secondary | ICD-10-CM | POA: Diagnosis not present

## 2020-03-18 DIAGNOSIS — Z13220 Encounter for screening for lipoid disorders: Secondary | ICD-10-CM | POA: Diagnosis not present

## 2020-03-18 DIAGNOSIS — Z1211 Encounter for screening for malignant neoplasm of colon: Secondary | ICD-10-CM | POA: Diagnosis not present

## 2020-03-18 DIAGNOSIS — M25562 Pain in left knee: Secondary | ICD-10-CM

## 2020-03-18 DIAGNOSIS — Z09 Encounter for follow-up examination after completed treatment for conditions other than malignant neoplasm: Secondary | ICD-10-CM | POA: Diagnosis not present

## 2020-03-18 DIAGNOSIS — Z1159 Encounter for screening for other viral diseases: Secondary | ICD-10-CM | POA: Diagnosis not present

## 2020-03-18 DIAGNOSIS — Z7689 Persons encountering health services in other specified circumstances: Secondary | ICD-10-CM

## 2020-03-18 DIAGNOSIS — Z131 Encounter for screening for diabetes mellitus: Secondary | ICD-10-CM

## 2020-03-18 LAB — POCT GLYCOSYLATED HEMOGLOBIN (HGB A1C): Hemoglobin A1C: 4.8 % (ref 4.0–5.6)

## 2020-03-18 MED ORDER — DICLOFENAC SODIUM 1 % EX GEL: 2.0000 g | Freq: Four times a day (QID) | CUTANEOUS | 0 refills | Status: DC | PRN

## 2020-03-18 MED FILL — DICLOFENAC SODIUM 1 % GEL: 1 | 12 days supply | Qty: 100 | Fill #0

## 2020-03-18 NOTE — Patient Instructions (Signed)
Radicular Pain Radicular pain is a type of pain that spreads from your back or neck along a spinal nerve. Spinal nerves are nerves that leave the spinal cord and go to the muscles. Radicular pain is sometimes called radiculopathy, radiculitis, or a pinched nerve. When you have this type of pain, you may also have weakness, numbness, or tingling in the area of your body that is supplied by the nerve. The pain may feel sharp and burning. Depending on which spinal nerve is affected, the pain may occur in the:  Neck area (cervical radicular pain). You may also feel pain, numbness, weakness, or tingling in the arms.  Mid-spine area (thoracic radicular pain). You would feel this pain in the back and chest. This type is rare.  Lower back area (lumbar radicular pain). You would feel this pain as low back pain. You may feel pain, numbness, weakness, or tingling in the buttocks or legs. Sciatica is a type of lumbar radicular pain that shoots down the back of the leg. Radicular pain occurs when one of the spinal nerves becomes irritated or squeezed (compressed). It is often caused by something pushing on a spinal nerve, such as one of the bones of the spine (vertebrae) or one of the round cushions between vertebrae (intervertebral disks). This can result from:  An injury.  Wear and tear or aging of a disk.  The growth of a bone spur that pushes on the nerve. Radicular pain often goes away when you follow instructions from your health care provider for relieving pain at home. Follow these instructions at home: Managing pain      If directed, put ice on the affected area: ? Put ice in a plastic bag. ? Place a towel between your skin and the bag. ? Leave the ice on for 20 minutes, 2-3 times a day.  If directed, apply heat to the affected area as often as told by your health care provider. Use the heat source that your health care provider recommends, such as a moist heat pack or a heating pad. ? Place  a towel between your skin and the heat source. ? Leave the heat on for 20-30 minutes. ? Remove the heat if your skin turns bright red. This is especially important if you are unable to feel pain, heat, or cold. You may have a greater risk of getting burned. Activity   Do not sit or rest in bed for long periods of time.  Try to stay as active as possible. Ask your health care provider what type of exercise or activity is best for you.  Avoid activities that make your pain worse, such as bending and lifting.  Do not lift anything that is heavier than 10 lb (4.5 kg), or the limit that you are told, until your health care provider says that it is safe.  Practice using proper technique when lifting items. Proper lifting technique involves bending your knees and rising up.  Do strength and range-of-motion exercises only as told by your health care provider or physical therapist. General instructions  Take over-the-counter and prescription medicines only as told by your health care provider.  Pay attention to any changes in your symptoms.  Keep all follow-up visits as told by your health care provider. This is important. ? Your health care provider may send you to a physical therapist to help with this pain. Contact a health care provider if:  Your pain and other symptoms get worse.  Your pain medicine is not   helping.  Your pain has not improved after a few weeks of home care.  You have a fever. Get help right away if:  You have severe pain, weakness, or numbness.  You have difficulty with bladder or bowel control. Summary  Radicular pain is a type of pain that spreads from your back or neck along a spinal nerve.  When you have radicular pain, you may also have weakness, numbness, or tingling in the area of your body that is supplied by the nerve.  The pain may feel sharp or burning.  Radicular pain may be treated with ice, heat, medicines, or physical therapy. This  information is not intended to replace advice given to you by your health care provider. Make sure you discuss any questions you have with your health care provider. Document Revised: 11/09/2017 Document Reviewed: 11/09/2017 Elsevier Patient Education  2020 [employer]  

## 2020-03-18 NOTE — Progress Notes (Signed)
[facility]  HPI  Mr. Janiel Crisostomo. Esker Dever  Is a 74 y.o.male presents for follow up from the emergency room on 3 separate occasions.  February 16, 2020 seen for cellulitis of the lower extremities treated with doxycycline 10 mg 3 times a day Keflex 500 mg 4 times a day .He presented to the emergency room on February 24, 2020 for lower extremity edema treated with oxycodone 5 / 325 and  prednisone 50 mg for 5 days at that time blood work was taken and a venous ultrasound completed .He returned back to the emergency room on March 05, 2020 for lumbar Lumbar radiculopathy treated with diclofenac 2 mg 4 times a day as needed for pain and placed back on prednisone for total of 14 days.  None today's visit required admission.  Today he presents for follow-up and establishment of care.  Emergency room notes indicate he needs to follow-up with Dr. Altamese Ponderosa orthopedic surgeon for low back pain. Pain located behind left knee and foot uses a walker for stability. Left foot swollen.Rates 9-10/10. Pain has been present for 2 months    Past Medical History:  Diagnosis Date  . Arthritis   . Gout   . Urticaria      Allergies  Allergen Reactions  . Other     Pt says he is not taking anything different--medication, food, not sure what he may be allergic to      Current Outpatient Medications on File Prior to Visit  Medication Sig Dispense Refill  . cetirizine (ZYRTEC) 10 MG tablet Take 10 mg by mouth daily.  0  . diclofenac Sodium (VOLTAREN) 1 % GEL Apply 2 g topically 4 (four) times daily as needed. 100 g 0  . EPINEPHrine 0.3 mg/0.3 mL IJ SOAJ injection Inject 0.3 mLs (0.3 mg total) into the muscle once. 1 Device 1  . hydrocortisone cream 0.5 % Apply 1 application topically at bedtime. Reported on 06/19/2015    . ibuprofen (ADVIL,MOTRIN) 200 MG tablet Take 200 mg by mouth daily as needed for headache or mild pain.    . predniSONE (DELTASONE) 10 MG tablet Take 1 tablet (10 mg total) by  mouth See admin instructions. Take 4 tablets (40 mg) daily for 3 days, then take 3 tablets (30 mg) daily for 3 days, then take 2 tablets (20 mg) daily for 3 days, then take 1 tablet (10 mg) daily time 5 days, for 14 days total symptoms. 32 tablet 0  . ranitidine (ZANTAC) 150 MG tablet Take 150 mg by mouth at bedtime.     No current facility-administered medications on file prior to visit.    ROS: all negative except above.   Physical Exam: Filed Weights   03/18/20 1423  Weight: 161 lb 12.8 oz (73.4 kg)   BP 102/73 (BP Location: Left Arm, Patient Position: Sitting, Cuff Size: Normal)   Pulse (!) 114   Temp (!) 97.3 F (36.3 C) (Temporal)   Ht 6\' 3"  (1.905 m)   Wt 161 lb 12.8 oz (73.4 kg)   SpO2 100%   BMI 20.22 kg/m  General Appearance: Well nourished, in no apparent distress. Thin frame Eyes: PERRLA, EOMs, conjunctiva no swelling or erythema Sinuses: No Frontal/maxillary tenderness ENT/Mouth: Ext aud canals clear, Left TM without erythema, bulging. Right ear TM not visible cerumen impaction   Hearing normal.  Neck: Supple, thyroid normal.  Respiratory: Respiratory effort normal, BS equal bilaterally without rales, rhonchi, wheezing or stridor.  Cardio: RRR with no  MRGs.  Left foot swollen Abdomen: Soft, + BS.  Non tender, no guarding, rebound, hernias, masses. Lymphatics: Non tender without lymphadenopathy.  Musculoskeletal: Full ROM, 5/5 strength, normal gait.  Skin: Warm, dry without rashes, lesions, ecchymosis.  Neuro: Cranial nerves intact. Normal muscle tone, no cerebellar symptoms. Sensation intact.  Psych: Awake and oriented X 3, normal affect, Insight and Judgment appropriate.   Atreyu was seen today for hospitalization follow-up and knee pain.  Diagnoses and all orders for this visit:  Screening for diabetes mellitus -     HgB A1c per ADA guidelines he is not prediabetic nor diabetic  Encounter to establish care Juluis Mire, NP-C will be your  (PCP) she is  mastered prepared . Able to diagnosed and treatment also  answer health concern as well as continuing care of varied medical conditions, not limited by cause, organ system, or diagnosis.   Hospital discharge follow-up 3 separate visits to the emergency room to include cellulitis of the lower extremity, lower extremity edema, and lumbar radiculopathy Notes indicate and referral needed for Dr. Legrand Como Hilts placed at today's visit.   Bilateral low back pain, unspecified chronicity, unspecified whether sciatica present -     diclofenac Sodium (VOLTAREN) 1 % GEL; Apply 2 g topically 4 (four) times daily as needed. -     Ambulatory referral to Orthopedic Surgery  Acute pain of left knee -     diclofenac Sodium (VOLTAREN) 1 % GEL; Apply 2 g topically 4 (four) times daily as needed. -     Ambulatory referral to Orthopedic Surgery  Colon cancer screening Referral to gastrology  Need for hepatitis C screening test Health maintenance care gaps and screening  Screening for hyperlipidemia Lipid Panel ordered   Reviewed encounters labs imaging  Kerin Perna, NP 2:39 PM

## 2020-03-19 LAB — LIPID PANEL
Chol/HDL Ratio: 3.5 ratio (ref 0.0–5.0)
Cholesterol, Total: 208 mg/dL — ABNORMAL HIGH (ref 100–199)
HDL: 60 mg/dL (ref >39)
LDL Chol Calc (NIH): 132 mg/dL — ABNORMAL HIGH (ref 0–99)
Triglycerides: 87 mg/dL (ref 0–149)
VLDL Cholesterol Cal: 16 mg/dL (ref 5–40)

## 2020-03-19 LAB — HEPATITIS C ANTIBODY: Hep C Virus Ab: <0.1 s/co ratio (ref 0.0–0.9)

## 2020-03-21 ENCOUNTER — Other Ambulatory Visit (INDEPENDENT_AMBULATORY_CARE_PROVIDER_SITE_OTHER): Payer: Self-pay | Admitting: Primary Care

## 2020-03-21 MED ORDER — ATORVASTATIN CALCIUM 20 MG PO TABS: 20.0000 mg | ORAL_TABLET | Freq: Every day | ORAL | 1 refills | Status: DC

## 2020-03-21 MED FILL — ATORVASTATIN CALCIUM 20 MG: 20 | 90 days supply | Qty: 90 | Fill #0

## 2020-03-22 ENCOUNTER — Other Ambulatory Visit: Payer: Self-pay

## 2020-03-22 ENCOUNTER — Encounter: Payer: Self-pay | Admitting: Family Medicine

## 2020-03-22 ENCOUNTER — Telehealth (INDEPENDENT_AMBULATORY_CARE_PROVIDER_SITE_OTHER): Payer: Self-pay

## 2020-03-22 ENCOUNTER — Ambulatory Visit (INDEPENDENT_AMBULATORY_CARE_PROVIDER_SITE_OTHER): Payer: Medicare HMO | Admitting: Family Medicine

## 2020-03-22 DIAGNOSIS — M545 Low back pain, unspecified: Secondary | ICD-10-CM | POA: Diagnosis not present

## 2020-03-22 MED ORDER — PREDNISONE 10 MG PO TABS: ORAL_TABLET | ORAL | 0 refills | Status: DC

## 2020-03-22 NOTE — Telephone Encounter (Signed)
Patient verified date of birth. He is aware that hepatitis C is negative. Labs normal except elevated cholesterol which can cause stroke and heart attack. Atorvastatin 20 mg sent in; take at bedtime and decrease fatty foods in diet. He verbalized understanding. He needs Rx sent to [facility] on [address]. Nat Christen, CMA

## 2020-03-22 NOTE — Telephone Encounter (Signed)
-----   Message from Kerin Perna, NP sent at 03/21/2020  4:21 PM EST ----- Labs are normal hepatitis C is negative except cholesterol is elevated can increase the risk of stroke and heart attack sent in atorvastatin 20 mg at bedtime decrease fatty foods in your diet

## 2020-03-22 NOTE — Progress Notes (Signed)
Office Visit Note   Patient: Sergio Montgomery.           Date of Birth: Sep 27, 1945           MRN: [id number] Visit Date: 03/22/2020 Requested by: Kerin Perna, NP [address] [place],   [id number] PCP: Vincente Liberty, MD  Subjective: Chief Complaint  Patient presents with  . Lower Back - Pain    Pain in the lower back,mainly left side, radiating down the left leg. Has been seen several times in the ED recently for this. The pain comes & goes.  . 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 Flared up in the knee again 2 months ago. Is feeling better. Recently started ambulating with a walker. Leg gives way sometimes, if the knee is "twisted a certain way." Swells off & on.    HPI: He is here with low back and left leg pain.  Symptoms started a couple weeks ago, no injury.  He woke up with severe pain in his left foot with some redness and swelling.  He went to the ER because after taking ibuprofen for a couple days it wasn't improving.  At the ER he was treated with antibiotics for cellulitis.  The redness improved, but his severe pain persisted and he started having pain all the way up the leg including in the left lower back.  Ultimately he had x-rays and MRI scan done which did not show any acute abnormality.  He was given prednisone and that has seemed to help quite a bit.  His pain is much more tolerable now, still some soreness in foot and ankle and in the posterior aspect of the left knee.  He does have a history of gout.  He is not diabetic.              ROS: No fevers or chills.  All other systems were reviewed and are negative.  Objective: Vital Signs: There were no vitals taken for this visit.  Physical Exam:  General:  Alert and oriented, in no acute distress. Pulm:  Breathing unlabored. Psy:  Normal mood, congruent affect. Skin: No erythema or rash. Low back: There is slight tenderness near the left SI joint.  Slight tenderness  in the sciatic notch.  Straight leg raise is negative, lower extremity strength and reflexes are normal. Left knee: Trace effusion with no warmth.  2+ patellofemoral crepitus.  Fullness in the popliteal fossa consistent with a possible cyst.  No significant joint line tenderness today. Left foot is not significantly tender today.    Imaging: No results found.  Assessment & Plan: 1.  Improving left lower extremity pain, suspect flareup of gout. -We will give him one more round of prednisone.  He will follow-up as needed.     Procedures: No procedures performed  No notes on file     PMFS History: Patient Active Problem List   Diagnosis Date Noted  . Urticaria 06/11/2015  . Angioedema 06/11/2015  . Chronic rhinitis 06/11/2015   Past Medical History:  Diagnosis Date  . Arthritis   . Gout   . Urticaria     Family History  Problem Relation Age of Onset  . Diabetes Sister   . Hypertension Brother     Past Surgical History:  Procedure Laterality Date  . APPENDECTOMY     Social History   Occupational History  . Not on file  Tobacco Use  .  Smoking status: Current Every Day Smoker    Packs/day: 0.50    Types: Cigarettes  . Smokeless tobacco: Never Used  Substance and Sexual Activity  . Alcohol use: Yes  . Drug use: Yes    Types: Marijuana  . Sexual activity: Not on file

## 2020-03-25 ENCOUNTER — Telehealth (INDEPENDENT_AMBULATORY_CARE_PROVIDER_SITE_OTHER): Payer: Self-pay | Admitting: Primary Care

## 2020-03-25 ENCOUNTER — Other Ambulatory Visit: Payer: Self-pay | Admitting: Pulmonary Disease

## 2020-03-25 MED ORDER — ATORVASTATIN CALCIUM 20 MG PO TABS
20.0000 mg | ORAL_TABLET | Freq: Every day | ORAL | 1 refills | Status: DC
Start: 2020-03-25 — End: 2022-01-27

## 2020-03-25 MED ORDER — ATORVASTATIN CALCIUM 20 MG PO TABS
20.0000 mg | ORAL_TABLET | Freq: Every day | ORAL | 1 refills | Status: DC
Start: 2020-03-25 — End: 2020-03-25

## 2020-03-25 NOTE — Telephone Encounter (Signed)
Per pt. Request, will send Atorvastatin to preferred pharmacy; [facility] on [address] [place].  (had been previously sent to [facility] on 03/21/20)

## 2020-03-25 NOTE — Telephone Encounter (Signed)
Medication: atorvastatin (LIPITOR) 20 MG tablet [[id number]], Medication was sent to wrong pharmacy- Can the medication be sent to the below pharmacy?   Has the patient contacted their pharmacy? YES  (Agent: If no, request that the patient contact the pharmacy for the refill.) (Agent: If yes, when and what did the pharmacy advise?)  Preferred Pharmacy (with phone number or street name): [facility] [phone] - [place], [place] [address]  [place], [place] [address]  Phone:  [phone] Fax:  [phone]   Agent: Please be advised that RX refills may take up to 3 business days. We ask that you follow-up with your pharmacy.

## 2020-03-25 NOTE — Telephone Encounter (Signed)
Per pt. Request, will send Atorvastatin to preferred pharmacy; [facility] on [address] [place].

## 2020-03-25 NOTE — Telephone Encounter (Signed)
Unable to send the Atorvastatin electronically from this encounter.  Will create new TE to send Rx for Atorvastatin to preferred pharmacy.

## 2021-01-04 ENCOUNTER — Ambulatory Visit (INDEPENDENT_AMBULATORY_CARE_PROVIDER_SITE_OTHER): Payer: Medicare HMO

## 2021-01-04 ENCOUNTER — Encounter (INDEPENDENT_AMBULATORY_CARE_PROVIDER_SITE_OTHER): Payer: Self-pay

## 2021-01-04 DIAGNOSIS — Z0000 Encounter for general adult medical examination without abnormal findings: Secondary | ICD-10-CM | POA: Diagnosis not present

## 2021-01-04 NOTE — Patient Instructions (Signed)
Health Maintenance, Male Adopting a healthy lifestyle and getting preventive care are important in promoting health and wellness. Ask your health care provider about: The right schedule for you to have regular tests and exams. Things you can do on your own to prevent diseases and keep yourself healthy. What should I know about diet, weight, and exercise? Eat a healthy diet  Eat a diet that includes plenty of vegetables, fruits, low-fat dairy products, and lean protein. Do not eat a lot of foods that are high in solid fats, added sugars, or sodium.  Maintain a healthy weight Body mass index (BMI) is a measurement that can be used to identify possible weight problems. It estimates body fat based on height and weight. Your health care provider can help determine your BMI and help you achieve or maintain ahealthy weight. Get regular exercise Get regular exercise. This is one of the most important things you can do for your health. Most adults should: Exercise for at least 150 minutes each week. The exercise should increase your heart rate and make you sweat (moderate-intensity exercise). Do strengthening exercises at least twice a week. This is in addition to the moderate-intensity exercise. Spend less time sitting. Even light physical activity can be beneficial. Watch cholesterol and blood lipids Have your blood tested for lipids and cholesterol at 75 years of age, then havethis test every 5 years. You may need to have your cholesterol levels checked more often if: Your lipid or cholesterol levels are high. You are older than 75 years of age. You are at high risk for heart disease. What should I know about cancer screening? Many types of cancers can be detected early and may often be prevented. Depending on your health history and family history, you may need to have cancer screening at various ages. This may include screening for: Colorectal cancer. Prostate cancer. Skin cancer. Lung  cancer. What should I know about heart disease, diabetes, and high blood pressure? Blood pressure and heart disease High blood pressure causes heart disease and increases the risk of stroke. This is more likely to develop in people who have high blood pressure readings, are of African descent, or are overweight. Talk with your health care provider about your target blood pressure readings. Have your blood pressure checked: Every 3-5 years if you are 18-39 years of age. Every year if you are 40 years old or older. If you are between the ages of 65 and 75 and are a current or former smoker, ask your health care provider if you should have a one-time screening for abdominal aortic aneurysm (AAA). Diabetes Have regular diabetes screenings. This checks your fasting blood sugar level. Have the screening done: Once every three years after age 45 if you are at a normal weight and have a low risk for diabetes. More often and at a younger age if you are overweight or have a high risk for diabetes. What should I know about preventing infection? Hepatitis B If you have a higher risk for hepatitis B, you should be screened for this virus. Talk with your health care provider to find out if you are at risk forhepatitis B infection. Hepatitis C Blood testing is recommended for: Everyone born from 1945 through 1965. Anyone with known risk factors for hepatitis C. Sexually transmitted infections (STIs) You should be screened each year for STIs, including gonorrhea and chlamydia, if: You are sexually active and are younger than 75 years of age. You are older than 75 years of age   and your health care provider tells you that you are at risk for this type of infection. Your sexual activity has changed since you were last screened, and you are at increased risk for chlamydia or gonorrhea. Ask your health care provider if you are at risk. Ask your health care provider about whether you are at high risk for HIV.  Your health care provider may recommend a prescription medicine to help prevent HIV infection. If you choose to take medicine to prevent HIV, you should first get tested for HIV. You should then be tested every 3 months for as long as you are taking the medicine. Follow these instructions at home: Lifestyle Do not use any products that contain nicotine or tobacco, such as cigarettes, e-cigarettes, and chewing tobacco. If you need help quitting, ask your health care provider. Do not use street drugs. Do not share needles. Ask your health care provider for help if you need support or information about quitting drugs. Alcohol use Do not drink alcohol if your health care provider tells you not to drink. If you drink alcohol: Limit how much you have to 0-2 drinks a day. Be aware of how much alcohol is in your drink. In the [place] one drink equals one 12 oz bottle of beer (355 mL), one 5 oz glass of wine (148 mL), or one 1 oz glass of hard liquor (44 mL). General instructions Schedule regular health, dental, and eye exams. Stay current with your vaccines. Tell your health care provider if: You often feel depressed. You have ever been abused or do not feel safe at home. Summary Adopting a healthy lifestyle and getting preventive care are important in promoting health and wellness. Follow your health care provider's instructions about healthy diet, exercising, and getting tested or screened for diseases. Follow your health care provider's instructions on monitoring your cholesterol and blood pressure. This information is not intended to replace advice given to you by your health care provider. Make sure you discuss any questions you have with your healthcare provider. Document Revised: 04/20/2018 Document Reviewed: 04/20/2018 Elsevier Patient Education  2022 Elsevier Inc.  

## 2021-01-04 NOTE — Progress Notes (Addendum)
Subjective:   Sergio Schwent. is a 75 y.o. male who presents for an Initial Medicare Annual Wellness Visit.  I connected with  Melchor Amour. on 01/04/21 by a video enabled telemedicine application and verified that I am speaking with the correct person using two identifiers.   I discussed the limitations of evaluation and management by telemedicine. The patient expressed understanding and agreed to proceed. Location patient: home  Location provider: work  Persons participating in the virtual visit: patient, CMA     Review of Systems    Defer to PCP       Objective:    There were no vitals filed for this visit. There is no height or weight on file to calculate BMI.  Advanced Directives 06/03/2015  Does Patient Have a Medical Advance Directive? No  Would patient like information on creating a medical advance directive? No - patient declined information    Current Medications (verified) Outpatient Encounter Medications as of 01/04/2021  Medication Sig   atorvastatin (LIPITOR) 20 MG tablet Take 1 tablet (20 mg total) by mouth daily.   cetirizine (ZYRTEC) 10 MG tablet Take 10 mg by mouth daily.   diclofenac Sodium (VOLTAREN) 1 % GEL Apply 2 g topically 4 (four) times daily as needed.   EPINEPHrine 0.3 mg/0.3 mL IJ SOAJ injection Inject 0.3 mLs (0.3 mg total) into the muscle once.   hydrocortisone cream 0.5 % Apply 1 application topically at bedtime. Reported on 06/19/2015   predniSONE (DELTASONE) 10 MG tablet Take 1 tablet (10 mg total) by mouth See admin instructions. Take 4 tablets (40 mg) daily for 3 days, then take 3 tablets (30 mg) daily for 3 days, then take 2 tablets (20 mg) daily for 3 days, then take 1 tablet (10 mg) daily time 5 days, for 14 days total symptoms. (Patient not taking: Reported on 03/22/2020)   predniSONE (DELTASONE) 10 MG tablet Take as directed for 12 days.  Daily dose 6,6,5,5,4,4,3,3,2,2,1,1.   ranitidine (ZANTAC) 150 MG tablet Take 150 mg by mouth at  bedtime.   No facility-administered encounter medications on file as of 01/04/2021.    Allergies (verified) Other   History: Past Medical History:  Diagnosis Date   Arthritis    Gout    Urticaria    Past Surgical History:  Procedure Laterality Date   APPENDECTOMY     Family History  Problem Relation Age of Onset   Diabetes Sister    Hypertension Brother    Social History   Socioeconomic History   Marital status: Single    Spouse name: Not on file   Number of children: Not on file   Years of education: Not on file   Highest education level: Not on file  Occupational History   Not on file  Tobacco Use   Smoking status: Every Day    Packs/day: 0.50    Types: Cigarettes   Smokeless tobacco: Never  Substance and Sexual Activity   Alcohol use: Yes   Drug use: Yes    Types: Marijuana   Sexual activity: Not on file  Other Topics Concern   Not on file  Social History Narrative   Not on file   Social Determinants of Health   Financial Resource Strain: Not on file  Food Insecurity: Not on file  Transportation Needs: Not on file  Physical Activity: Not on file  Stress: Not on file  Social Connections: Not on file    Tobacco Counseling Ready to quit:  Not Answered Counseling given: Not Answered   Clinical Intake:                 Diabetic?No         Activities of Daily Living No flowsheet data found.  Patient Care Team: Vincente Liberty, MD as PCP - General (Pulmonary Disease)  Indicate any recent Medical Services you may have received from other than [facility] providers in the past year (date may be approximate).     Assessment:   This is a routine wellness examination for Sergio Montgomery.  Hearing/Vision screen No results found.  Dietary issues and exercise activities discussed:     Goals Addressed   None   Depression Screen PHQ 2/9 Scores 03/18/2020  PHQ - 2 Score 3  PHQ- 9 Score 4    Fall Risk Fall Risk  03/18/2020  Falls in the past  year? 0    FALL RISK PREVENTION PERTAINING TO THE HOME:  Any stairs in or around the home? No  If so, are there any without handrails?  N/A Home free of loose throw rugs in walkways, pet beds, electrical cords, etc? No  Adequate lighting in your home to reduce risk of falls? Yes   ASSISTIVE DEVICES UTILIZED TO PREVENT FALLS:  Life alert? No  Use of a cane, walker or w/c? No  Grab bars in the bathroom? Yes  Shower chair or bench in shower? No  Elevated toilet seat or a handicapped toilet? No   TIMED UP AND GO:  Was the test performed? No .    Cognitive Function:        Immunizations  There is no immunization history on file for this patient.  TDAP status: Due, Education has been provided regarding the importance of this vaccine. Advised may receive this vaccine at local pharmacy or Health Dept. Aware to provide a copy of the vaccination record if obtained from local pharmacy or Health Dept. Verbalized acceptance and understanding.  Flu Vaccine status: Due, Education has been provided regarding the importance of this vaccine. Advised may receive this vaccine at local pharmacy or Health Dept. Aware to provide a copy of the vaccination record if obtained from local pharmacy or Health Dept. Verbalized acceptance and understanding.  Pneumococcal vaccine status: Due, Education has been provided regarding the importance of this vaccine. Advised may receive this vaccine at local pharmacy or Health Dept. Aware to provide a copy of the vaccination record if obtained from local pharmacy or Health Dept. Verbalized acceptance and understanding.  Covid-19 vaccine status: Declined, Education has been provided regarding the importance of this vaccine but patient still declined. Advised may receive this vaccine at local pharmacy or Health Dept.or vaccine clinic. Aware to provide a copy of the vaccination record if obtained from local pharmacy or Health Dept. Verbalized acceptance and  understanding.  Qualifies for Shingles Vaccine? Yes   Zostavax completed No   Shingrix Completed?: No.    Education has been provided regarding the importance of this vaccine. Patient has been advised to call insurance company to determine out of pocket expense if they have not yet received this vaccine. Advised may also receive vaccine at local pharmacy or Health Dept. Verbalized acceptance and understanding.  Screening Tests Health Maintenance  Topic Date Due   COVID-19 Vaccine (1) Never done   COLONOSCOPY (Pts 45-62yr Insurance coverage will need to be confirmed)  Never done   Zoster Vaccines- Shingrix (1 of 2) Never done   INFLUENZA VACCINE  12/09/2020   TETANUS/TDAP  03/18/2021 (Originally 07/29/1964)   PNA vac Low Risk Adult (1 of 2 - PCV13) 03/18/2021 (Originally 07/30/2010)   Hepatitis C Screening  Completed   HPV VACCINES  Aged Out    Health Maintenance  Health Maintenance Due  Topic Date Due   COVID-19 Vaccine (1) Never done   COLONOSCOPY (Pts 45-25yr Insurance coverage will need to be confirmed)  Never done   Zoster Vaccines- Shingrix (1 of 2) Never done   INFLUENZA VACCINE  12/09/2020    Patient declined Colon Screening  Lung Cancer Screening: (Low Dose CT Chest recommended if Age 75-80years, 30 pack-year currently smoking OR have quit w/in 15years.) does qualify.   Lung Cancer Screening Referral: No  Additional Screening:  Hepatitis C Screening: does qualify; Completed 03/18/2020  Vision Screening: Recommended annual ophthalmology exams for early detection of glaucoma and other disorders of the eye. Is the patient up to date with their annual eye exam?  No  Who is the provider or what is the name of the office in which the patient attends annual eye exams? N/a If pt is not established with a provider, would they like to be referred to a provider to establish care?  Patient will find an eye doctor to go to .   Dental Screening: Recommended annual dental exams  for proper oral hygiene  Community Resource Referral / Chronic Care Management: CRR required this visit?  No   CCM required this visit?  No      Plan:     I have personally reviewed and noted the following in the patient's chart:   Medical and social history Use of alcohol, tobacco or illicit drugs  Current medications and supplements including opioid prescriptions. Patient is not currently taking opioid prescriptions. Functional ability and status Nutritional status Physical activity Advanced directives List of other physicians Hospitalizations, surgeries, and ER visits in previous 12 months Vitals Screenings to include cognitive, depression, and falls Referrals and appointments  In addition, I have reviewed and discussed with patient certain preventive protocols, quality metrics, and best practice recommendations. A written personalized care plan for preventive services as well as general preventive health recommendations were provided to patient.     AThressa Sheller [place]  01/04/2021   Nurse Notes: Non face to face 20 minute visit.   Sergio Montgomery, Thank you for taking time to come for your Medicare Wellness Visit. I appreciate your ongoing commitment to your health goals. Please review the following plan we discussed and let me know if I can assist you in the future.   These are the goals we discussed:  Goals   None     This is a list of the screening recommended for you and due dates:  Health Maintenance  Topic Date Due   COVID-19 Vaccine (1) Never done   Colon Cancer Screening  Never done   Zoster (Shingles) Vaccine (1 of 2) Never done   Flu Shot  12/09/2020   Tetanus Vaccine  03/18/2021*   Pneumonia vaccines (1 of 2 - PCV13) 03/18/2021*   Hepatitis C Screening: USPSTF Recommendation to screen - Ages 125-79yo.  Completed   HPV Vaccine  Aged Out  *Topic was postponed. The date shown is not the original due date.

## 2021-12-30 ENCOUNTER — Emergency Department (HOSPITAL_BASED_OUTPATIENT_CLINIC_OR_DEPARTMENT_OTHER)
Admission: EM | Admit: 2021-12-30 | Discharge: 2021-12-30 | Disposition: A | Discharge status: Home / Self Care | Payer: Medicare Other | Attending: Emergency Medicine | Admitting: Emergency Medicine

## 2021-12-30 ENCOUNTER — Encounter (HOSPITAL_BASED_OUTPATIENT_CLINIC_OR_DEPARTMENT_OTHER): Payer: Self-pay

## 2021-12-30 ENCOUNTER — Emergency Department (HOSPITAL_BASED_OUTPATIENT_CLINIC_OR_DEPARTMENT_OTHER): Payer: Medicare Other

## 2021-12-30 ENCOUNTER — Other Ambulatory Visit: Payer: Self-pay

## 2021-12-30 DIAGNOSIS — I471 Supraventricular tachycardia: Secondary | ICD-10-CM | POA: Insufficient documentation

## 2021-12-30 DIAGNOSIS — F17210 Nicotine dependence, cigarettes, uncomplicated: Secondary | ICD-10-CM | POA: Diagnosis not present

## 2021-12-30 DIAGNOSIS — R0789 Other chest pain: Secondary | ICD-10-CM | POA: Insufficient documentation

## 2021-12-30 LAB — CBC WITH DIFFERENTIAL/PLATELET
Abs Immature Granulocytes: 0.01 10*3/uL (ref 0.00–0.07)
Basophils Absolute: 0 10*3/uL (ref 0.0–0.1)
Basophils Relative: 0 %
Eosinophils Absolute: 0.2 10*3/uL (ref 0.0–0.5)
Eosinophils Relative: 3 %
HCT: 42.6 % (ref 39.0–52.0)
Hemoglobin: 14.7 g/dL (ref 13.0–17.0)
Immature Granulocytes: 0 %
Lymphocytes Relative: 47 %
Lymphs Abs: 2.1 10*3/uL (ref 0.7–4.0)
MCH: 33.3 pg (ref 26.0–34.0)
MCHC: 34.5 g/dL (ref 30.0–36.0)
MCV: 96.6 fL (ref 80.0–100.0)
Monocytes Absolute: 0.4 10*3/uL (ref 0.1–1.0)
Monocytes Relative: 8 %
Neutro Abs: 2 10*3/uL (ref 1.7–7.7)
Neutrophils Relative %: 42 %
Platelets: 545 10*3/uL — ABNORMAL HIGH (ref 150–400)
RBC: 4.41 MIL/uL (ref 4.22–5.81)
RDW: 13.7 % (ref 11.5–15.5)
WBC: 4.6 10*3/uL (ref 4.0–10.5)
nRBC: 0 % (ref 0.0–0.2)

## 2021-12-30 LAB — COMPREHENSIVE METABOLIC PANEL
ALT: 17 U/L (ref 0–44)
AST: 25 U/L (ref 15–41)
Albumin: 3.2 g/dL — ABNORMAL LOW (ref 3.5–5.0)
Alkaline Phosphatase: 55 U/L (ref 38–126)
Anion gap: 9 (ref 5–15)
BUN: 12 mg/dL (ref 8–23)
CO2: 23 mmol/L (ref 22–32)
Calcium: 9.3 mg/dL (ref 8.9–10.3)
Chloride: 108 mmol/L (ref 98–111)
Creatinine, Ser: 1.15 mg/dL (ref 0.61–1.24)
GFR, Estimated: >60 mL/min (ref >60)
Glucose, Bld: 98 mg/dL (ref 70–99)
Potassium: 3.6 mmol/L (ref 3.5–5.1)
Sodium: 140 mmol/L (ref 135–145)
Total Bilirubin: 0.5 mg/dL (ref 0.3–1.2)
Total Protein: 7.3 g/dL (ref 6.5–8.1)

## 2021-12-30 MED ORDER — ASPIRIN 81 MG PO CHEW
324.0000 mg | CHEWABLE_TABLET | Freq: Once | ORAL | Status: AC
  Administered 2021-12-30: 324 mg via ORAL
  Filled 2021-12-30: qty 4

## 2021-12-30 MED ORDER — SODIUM CHLORIDE 0.9 % IV SOLN: INTRAVENOUS | Status: DC

## 2021-12-30 MED ORDER — ADENOSINE 6 MG/2ML IV SOLN
INTRAVENOUS | Status: AC | PRN
  Administered 2021-12-30 (×2): 6 mg via INTRAVENOUS

## 2021-12-30 MED ORDER — SODIUM CHLORIDE 0.9 % IV SOLN
INTRAVENOUS | Status: AC | PRN
  Administered 2021-12-30: 500 mL via INTRAVENOUS

## 2021-12-30 MED ORDER — ADENOSINE 6 MG/2ML IV SOLN
INTRAVENOUS | Status: AC
  Filled 2021-12-30: qty 6

## 2021-12-30 MED ORDER — SODIUM CHLORIDE 0.9 % IV BOLUS: 500.0000 mL | Freq: Once | INTRAVENOUS | Status: DC

## 2021-12-30 NOTE — ED Provider Notes (Signed)
3:47 PM Care assumed from Dr. Rogene Houston.  At time of transfer of care, patient is waiting for results of electrolytes and reassessment.  Patient presented with SVT causing some chest discomfort symptoms that have resolved after he converted.  Plan of care is to assess electrolytes and if they are reassuring, discharged for outpatient cardiology follow-up.  Patient agreed with this plan with previous team.  If there are any concerning findings, anticipate repletion.  Anticipate discharge home after work-up is completed.  Electrolytes came back overall reassuring.  Patient remained stable for over 4 hours and would like to go home now.  Given his stability in sinus rhythm, he will follow-up with outpatient cardiology and be discharged.  Patient agrees and patient discharged in good condition.   Clinical Impression: 1. SVT (supraventricular tachycardia) (HCC)     Disposition: Discharge  Condition: Good  I have discussed the results, Dx and Tx plan with the pt(& family if present). He/she/they expressed understanding and agree(s) with the plan. Discharge instructions discussed at great length. Strict return precautions discussed and pt &/or family have verbalized understanding of the instructions. No further questions at time of discharge.    New Prescriptions   No medications on file    Follow Up: [facility] [address], [place] [phone] Schedule an appointment as soon as possible for a visit       Arieon Corcoran, Gwenyth Allegra, MD 12/30/21 [phone]

## 2021-12-30 NOTE — Discharge Instructions (Addendum)
An appointment to follow-up with cardiology.  Return for any rapid heart rate last 40 minutes or longer if you do not feel well or for any new or worse chest pain.

## 2021-12-30 NOTE — ED Notes (Signed)
Pt noted to be in SVT at a rate of 194 in triage. Pt brought back to room immediately, Dr. Rogene Houston at bedside. Pt alert and oriented, NAD noted.

## 2021-12-30 NOTE — ED Triage Notes (Addendum)
C/o chest pressure, diaphoresis and shortness of breath since this morning. Mild pain at time of triage, no diaphoresis noted. Denies cardiac hx

## 2021-12-30 NOTE — ED Provider Notes (Signed)
[facility] EMERGENCY DEPARTMENT Provider Note   CSN: [id number] Arrival date & time: 12/30/21  1257     History  Chief Complaint  Patient presents with   Chest Pain    Sergio Montgomery. is a 76 y.o. male.  Patient about 2 hours prior to arrival was not feeling well.  Felt a little lightheaded has some mild chest discomfort substernal area.  Past medical history noncontributory patient still active smoker.  Patient cannot tell if his heart rate was going fast.  Here in triage she was noted to have a rapid heart rate of 1 80-1 90s.  Patient not on any blood thinners.  No significant chest pain earlier in the day.       Home Medications Prior to Admission medications   Medication Sig Start Date End Date Taking? Authorizing Provider  atorvastatin (LIPITOR) 20 MG tablet Take 1 tablet (20 mg total) by mouth daily. Patient not taking: Reported on 01/04/2021 03/25/20   Kerin Perna, NP  cetirizine (ZYRTEC) 10 MG tablet Take 10 mg by mouth daily. Patient not taking: Reported on 01/04/2021 05/01/15   [provider]  diclofenac Sodium (VOLTAREN) 1 % GEL Apply 2 g topically 4 (four) times daily as needed. Patient not taking: Reported on 01/04/2021 03/18/20   Kerin Perna, NP  EPINEPHrine 0.3 mg/0.3 mL IJ SOAJ injection Inject 0.3 mLs (0.3 mg total) into the muscle once. Patient not taking: Reported on 01/04/2021 06/03/15   Elnora Morrison, MD  hydrocortisone cream 0.5 % Apply 1 application topically at bedtime. Reported on 06/19/2015 Patient not taking: Reported on 01/04/2021    [provider]  predniSONE (DELTASONE) 10 MG tablet Take 1 tablet (10 mg total) by mouth See admin instructions. Take 4 tablets (40 mg) daily for 3 days, then take 3 tablets (30 mg) daily for 3 days, then take 2 tablets (20 mg) daily for 3 days, then take 1 tablet (10 mg) daily time 5 days, for 14 days total symptoms. Patient not taking: No sig reported 03/05/20   Sponseller, Eugene Garnet  R, PA-C  predniSONE (DELTASONE) 10 MG tablet Take as directed for 12 days.  Daily dose 6,6,5,5,4,4,3,3,2,2,1,1. Patient not taking: Reported on 01/04/2021 03/22/20   Hilts, Legrand Como, MD  ranitidine (ZANTAC) 150 MG tablet Take 150 mg by mouth at bedtime. Patient not taking: Reported on 01/04/2021    [provider]      Allergies    Other    Review of Systems   Review of Systems  Constitutional:  Negative for chills and fever.  HENT:  Negative for ear pain and sore throat.   Eyes:  Negative for pain and visual disturbance.  Respiratory:  Negative for cough and shortness of breath.   Cardiovascular:  Positive for chest pain and palpitations.  Gastrointestinal:  Negative for abdominal pain and vomiting.  Genitourinary:  Negative for dysuria and hematuria.  Musculoskeletal:  Negative for arthralgias and back pain.  Skin:  Negative for color change and rash.  Neurological:  Positive for light-headedness. Negative for seizures and syncope.  All other systems reviewed and are negative.   Physical Exam Updated Vital Signs BP 120/78   Pulse 89   Temp (!) 97 F (36.1 C) (Tympanic)   Resp 17   Ht 1.905 m ('6\' 3"'$ )   Wt 77.1 kg   SpO2 100%   BMI 21.25 kg/m  Physical Exam Vitals and nursing note reviewed.  Constitutional:      General: He is  in acute distress.     Appearance: He is well-developed. He is not ill-appearing.  HENT:     Head: Normocephalic and atraumatic.  Eyes:     Conjunctiva/sclera: Conjunctivae normal.  Cardiovascular:     Rate and Rhythm: Regular rhythm. Tachycardia present.     Heart sounds: No murmur heard. Pulmonary:     Effort: Pulmonary effort is normal. No tachypnea or respiratory distress.     Breath sounds: Normal breath sounds. No decreased breath sounds, wheezing, rhonchi or rales.  Chest:     Chest wall: No tenderness.  Abdominal:     Palpations: Abdomen is soft.     Tenderness: There is no abdominal tenderness.  Musculoskeletal:         General: No swelling.     Cervical back: Neck supple.     Right lower leg: No edema.     Left lower leg: No edema.  Skin:    General: Skin is warm and dry.     Capillary Refill: Capillary refill takes less than 2 seconds.  Neurological:     General: No focal deficit present.     Mental Status: He is alert and oriented to person, place, and time.  Psychiatric:        Mood and Affect: Mood normal.     ED Results / Procedures / Treatments   Labs (all labs ordered are listed, but only abnormal results are displayed) Labs Reviewed  CBC WITH DIFFERENTIAL/PLATELET - Abnormal; Notable for the following components:      Result Value   Platelets 545 (*)    All other components within normal limits  COMPREHENSIVE METABOLIC PANEL    EKG EKG Interpretation  Date/Time:  Tuesday December 30 2021 13:07:08 EDT Ventricular Rate:  196 PR Interval:    QRS Duration: 80 QT Interval:  224 QTC Calculation: 404 R Axis:   -36 Text Interpretation: Supraventricular tachycardia Left axis deviation ST depression, consider subendocardial injury Nonspecific T wave abnormality Abnormal ECG When compared with ECG of 16-Feb-2020 08:07, PREVIOUS ECG IS PRESENT Confirmed by Fredia Sorrow [phone]) on 12/30/2021 1:47:40 PM  Radiology DG Chest Port 1 View  Result Date: 12/30/2021 CLINICAL DATA:  Cardiac palpitations. EXAM: PORTABLE CHEST 1 VIEW COMPARISON:  August 03, 2013. FINDINGS: The heart size and mediastinal contours are within normal limits. Both lungs are clear. Hyperexpansion of the lungs is noted. The visualized skeletal structures are unremarkable. IMPRESSION: No active disease. Electronically Signed   By: Marijo Conception M.D.   On: 12/30/2021 13:48    Procedures Procedures    Medications Ordered in ED Medications  adenosine (ADENOCARD) 6 MG/2ML injection ( Intravenous Canceled Entry 12/30/21 1330)  0.9 %  sodium chloride infusion (500 mLs Intravenous New Bag/Given 12/30/21 1320)    ED Course/  Medical Decision Making/ A&P                           Medical Decision Making Amount and/or Complexity of Data Reviewed Labs: ordered. Radiology: ordered.  Risk OTC drugs. Prescription drug management.   CRITICAL CARE Performed by: Fredia Sorrow Total critical care time: 45 minutes Critical care time was exclusive of separately billable procedures and treating other patients. Critical care was necessary to treat or prevent imminent or life-threatening deterioration. Critical care was time spent personally by me on the following activities: development of treatment plan with patient and/or surrogate as well as nursing, discussions with consultants, evaluation of patient's response to treatment,  examination of patient, obtaining history from patient or surrogate, ordering and performing treatments and interventions, ordering and review of laboratory studies, ordering and review of radiographic studies, pulse oximetry and re-evaluation of patient's condition.  Patient presenting with narrow complex rapid heart rate suggestive of supraventricular tachycardia with a rate around 196.  Patient with minimal symptoms.  Some mild chest discomfort.  Thinks that things started may be couple hours prior to arrival.  Could not tell his heart rate was fast.  Did feel a little lightheaded.  Patient treated with adenosine 6 mg with conversion back to normal sinus rhythm with occasional PVCs.  Did provide 1 dose of 6 mg adenosine did not get any kind of pauses or block so we determined that that probably did not get to him and enough time so we repeated it.  Patient was on the [facility] monitor with pads.  Patient also on 2 L of oxygen.  Patient tolerated all very well.  We will check labs chest x-ray and monitor.  Postconversion EKG was sinus tachycardia with heart rate of 99 no evidence of any ST segment elevation believe suggestive of STEMI.  Patient CBC white count 4.6 hemoglobin 14.7.  Patient  electrolytes are still pending.  Chest x-ray without any active disease.  Patient is staying in sinus rhythm and is completely asymptomatic.  If electrolytes show no significant abnormality patient can probably be discharged home with follow-up with cardiology.  Patient also counseled to avoid caffeine.  Final Clinical Impression(s) / ED Diagnoses Final diagnoses:  SVT (supraventricular tachycardia) ([place])    Rx / DC Orders ED Discharge Orders     None         Fredia Sorrow, MD 12/30/21 [id number]

## 2021-12-30 NOTE — ED Notes (Signed)
Unable to obtain labs at this time off of either IV.

## 2022-01-26 ENCOUNTER — Ambulatory Visit: Payer: Medicare Other | Attending: Interventional Cardiology

## 2022-01-26 DIAGNOSIS — I471 Supraventricular tachycardia: Secondary | ICD-10-CM

## 2022-01-26 NOTE — Progress Notes (Unsigned)
Cardiology Office Note:    Date:  01/27/2022   ID:  Sergio Amour., DOB 1945-10-24, MRN [id number]  PCP:  Patient, No Pcp Per  Cardiologist:  None   Referring MD: Fredia Sorrow, MD   Chief Complaint  Patient presents with   Irregular Heart Beat    PSVT    History of Present Illness:    Sergio Wolbert. is a 76 y.o. male with a hx of PSVT referred from ER by Dr. Rogene Houston and converted with iv adenosine 6 mg x 2.  Medical problems include hyperlipidemia, peripheral edema, and urticaria.  Vague sensation in his chest on the day that he went to the ER (01/01/2022) and was found to have PSVT at a rate of 196 bpm.  That sensation has not returned since the event 3 weeks ago.  He did not have syncope or chest pain.  He does not receive chronic longitudinal care.  He is troubled by arthritis.  Has had cellulitis in the past has.  No episodes of syncope, neurological complaints, or difficulty lying down.  Past Medical History:  Diagnosis Date   Arthritis    Cellulitis of lower extremity    Gout    Lower extremity edema    Lumbar radiculopathy    SVT (supraventricular tachycardia) (HCC)    Urticaria     Past Surgical History:  Procedure Laterality Date   APPENDECTOMY      Current Medications: Current Meds  Medication Sig   Ibuprofen (IBU PO) Take 1-2 tablets by mouth as needed.     Allergies:   Other   Social History   Socioeconomic History   Marital status: Single    Spouse name: Not on file   Number of children: Not on file   Years of education: Not on file   Highest education level: Not on file  Occupational History   Not on file  Tobacco Use   Smoking status: Every Day    Packs/day: 0.50    Types: Cigarettes   Smokeless tobacco: Never  Substance and Sexual Activity   Alcohol use: Yes   Drug use: Yes    Types: Marijuana   Sexual activity: Not on file  Other Topics Concern   Not on file  Social History Narrative   Not on file   Social Determinants  of Health   Financial Resource Strain: Low Risk  (01/04/2021)   Overall Financial Resource Strain (CARDIA)    Difficulty of Paying Living Expenses: Not very hard  Food Insecurity: No Food Insecurity (01/04/2021)   Hunger Vital Sign    Worried About Running Out of Food in the Last Year: Never true    [place] in the Last Year: Never true  Transportation Needs: No Transportation Needs (01/04/2021)   PRAPARE - Hydrologist (Medical): No    Lack of Transportation (Non-Medical): No  Physical Activity: Insufficiently Active (01/04/2021)   Exercise Vital Sign    Days of Exercise per Week: 2 days    Minutes of Exercise per Session: 10 min  Stress: No Stress Concern Present (01/04/2021)   [place]    Feeling of Stress : Only a little  Social Connections: Socially Isolated (01/04/2021)   Social Connection and Isolation Panel [NHANES]    Frequency of Communication with Friends and Family: Twice a week    Frequency of Social Gatherings with Friends and Family: Never  Attends Religious Services: Never    Active Member of Clubs or Organizations: No    Attends Archivist Meetings: Never    Marital Status: Widowed     Family History: The patient's family history includes Diabetes in his sister; Hypertension in his brother.  ROS:   Please see the history of present illness.    No particular complaints all other systems reviewed and are negative.  EKGs/Labs/Other Studies Reviewed:    The following studies were reviewed today: No cardiac imaging.  EKG:  EKG  performed 12/30/2021 revealing SVT at 196 bpm.  The tracing performed on 01/27/2022 demonstrates normal sinus rhythm at 78 bpm.  Normal PR interval.  And normal overall EKG appearance.  Recent Labs: 12/30/2021: ALT 17; BUN 12; Creatinine, Ser 1.15; Hemoglobin 14.7; Platelets 545; Potassium 3.6; Sodium 140  Recent Lipid Panel     Component Value Date/Time   CHOL 208 (H) 03/18/2020 1527   TRIG 87 03/18/2020 1527   HDL 60 03/18/2020 1527   CHOLHDL 3.5 03/18/2020 1527   CHOLHDL 5.8 Ratio 01/23/2010 2054   VLDL 14 01/23/2010 2054   LDLCALC 132 (H) 03/18/2020 1527    Physical Exam:    VS:  BP 102/64   Pulse 88   Ht '6\' 3"'$  (1.905 m)   Wt 154 lb 9.6 oz (70.1 kg)   SpO2 99%   BMI 19.32 kg/m     Wt Readings from Last 3 Encounters:  01/27/22 154 lb 9.6 oz (70.1 kg)  12/30/21 170 lb (77.1 kg)  03/18/20 161 lb 12.8 oz (73.4 kg)     GEN: Slender and consistent with age. No acute distress HEENT: Normal NECK: No JVD. LYMPHATICS: No lymphadenopathy CARDIAC: No murmur. RRR no gallop, or edema. VASCULAR:  Normal Pulses. No bruits. RESPIRATORY:  Clear to auscultation without rales, wheezing or rhonchi  ABDOMEN: Soft, non-tender, non-distended, No pulsatile mass, MUSCULOSKELETAL: No deformity  SKIN: Warm and dry NEUROLOGIC:  Alert and oriented x 3 PSYCHIATRIC:  Normal affect   ASSESSMENT:    1. PSVT (paroxysmal supraventricular tachycardia) (HCC)    PLAN:    In order of problems listed above:  Continuous monitor for 2 weeks to rule out paroxysmal supraventricular tachycardia/burden which may be relevant relative to long-term management strategy.  2D Doppler echocardiogram to rule out structural abnormality.   Medication Adjustments/Labs and Tests Ordered: Current medicines are reviewed at length with the patient today.  Concerns regarding medicines are outlined above.  No orders of the defined types were placed in this encounter.  No orders of the defined types were placed in this encounter.   There are no Patient Instructions on file for this visit.   Signed, Sinclair Grooms, MD  01/27/2022 1:26 PM    [facility]

## 2022-01-27 ENCOUNTER — Ambulatory Visit: Payer: Medicare Other | Attending: Interventional Cardiology | Admitting: Interventional Cardiology

## 2022-01-27 ENCOUNTER — Encounter: Payer: Self-pay | Admitting: Interventional Cardiology

## 2022-01-27 VITALS — BP 102/64 | HR 88 | Ht 75.0 in | Wt 154.6 lb

## 2022-01-27 DIAGNOSIS — I471 Supraventricular tachycardia: Secondary | ICD-10-CM

## 2022-01-27 NOTE — Patient Instructions (Signed)
Medication Instructions:  Your physician recommends that you continue on your current medications as directed. Please refer to the Current Medication list given to you today.  *If you need a refill on your cardiac medications before your next appointment, please call your pharmacy*  Lab Work: NONE  Testing/Procedures: Your physician has requested that you wear a Zio heart monitor for 2 weeks (14 days). This will be mailed to your home with instructions on how to apply the monitor and how to return it when finished.  Your physician has requested that you have an echocardiogram. Echocardiography is a painless test that uses sound waves to create images of your heart. It provides your doctor with information about the size and shape of your heart and how well your heart's chambers and valves are working. This procedure takes approximately one hour. There are no restrictions for this procedure.  Follow-Up: Will be determined based on results of echocardiogram and heart monitor.  Other Instructions ZIO AT Long term monitor-Live Telemetry  Your physician has requested you wear a ZIO patch monitor for 14 days.  This is a single patch monitor. [employer] supplies one patch monitor per enrollment. Additional  stickers are not available.  Please do not apply patch if you will be having a Nuclear Stress Test, Echocardiogram, Cardiac CT, MRI,  or Chest Xray during the period you would be wearing the monitor. The patch cannot be worn during  these tests. You cannot remove and re-apply the ZIO AT patch monitor.  Your ZIO patch monitor will be mailed 3 day USPS to your address on file. It may take 3-5 days to  receive your monitor after you have been enrolled.  Once you have received your monitor, please review the enclosed instructions. Your monitor has  already been registered assigning a specific monitor serial # to you.   Billing and Patient Assistance Program information  Theodore Demark has been supplied  with any insurance information on record for billing. [employer] offers a sliding scale Patient Assistance Program for patients without insurance, or whose  insurance does not completely cover the cost of the ZIO patch monitor. You must apply for the  Patient Assistance Program to qualify for the discounted rate. To apply, call [employer] at [phone],  select option 4, select option 2 , ask to apply for the Patient Assistance Program, (you can request an  interpreter if needed). [employer] will ask your household income and how many people are in your  household. [employer] will quote your out-of-pocket cost based on this information. They will also be able  to set up a 12 month interest free payment plan if needed.  Applying the monitor   Shave hair from upper left chest.  Hold the abrader disc by orange tab. Rub the abrader in 40 strokes over left upper chest as indicated in  your monitor instructions.  Clean area with 4 enclosed alcohol pads. Use all pads to ensure the area is cleaned thoroughly. Let  dry.  Apply patch as indicated in monitor instructions. Patch will be placed under collarbone on left side of  chest with arrow pointing upward.  Rub patch adhesive wings for 2 minutes. Remove the white label marked "1". Remove the white label  marked "2". Rub patch adhesive wings for 2 additional minutes.  While looking in a mirror, press and release button in center of patch. A small green light will flash 3-4  times. This will be your only indicator that the monitor has been turned on.  Do not shower for the first 24 hours. You may shower after the first 24 hours.  Press the button if you feel a symptom. You will hear a small click. Record Date, Time and Symptom in  the Patient Log.   Starting the Gateway  In your kit there is a Hydrographic surveyor box the size of a cellphone. This is Airline pilot. It transmits all your  recorded data to [facility]. This box must always stay within 10 feet of you.  Open the box and push the *  button. There will be a light that blinks orange and then green a few times. When the light stops  blinking, the Gateway is connected to the ZIO patch. Call [employer] at [phone] to confirm your monitor is transmitting.  Returning your monitor  Remove your patch and place it inside the [place]. In the lower half of the Gateway there is a white  bag with prepaid postage on it. Place Gateway in bag and seal. Mail package back to [place] as soon as  possible. Your physician should have your final report approximately 7 days after you have mailed back  your monitor. Call [place] at [phone] if you have questions regarding your ZIO AT  patch monitor. Call them immediately if you see an orange light blinking on your monitor.  If your monitor falls off in less than 4 days, contact our Monitor department at [phone]. If your  monitor becomes loose or falls off after 4 days call [employer] at [phone] for suggestions on  securing your monitor   Important Information About Sugar

## 2022-01-27 NOTE — Progress Notes (Unsigned)
Enrolled for [employer] to mail a ZIO XT long term holter monitor to the patients address on file.  

## 2022-02-06 ENCOUNTER — Other Ambulatory Visit (HOSPITAL_COMMUNITY): Payer: Medicare Other

## 2022-02-06 ENCOUNTER — Encounter (HOSPITAL_COMMUNITY): Payer: Self-pay | Admitting: Interventional Cardiology

## 2022-05-03 IMAGING — MR MR LUMBAR SPINE W/O CM
4 of 5 series · 18 of 48 positions shown · non-contrast
Comparison: MRI 12/16/2003.

CLINICAL DATA: Low back pain.

EXAM:
MRI LUMBAR SPINE WITHOUT CONTRAST
TECHNIQUE: Multiplanar, multisequence MR imaging of the lumbar spine was
performed. No intravenous contrast was administered.

[Series 3: T2 · sagittal · 4.0mm · 0.55mm/px · 5 of 15 slices shown (1 of 2)]
[im 1/15]
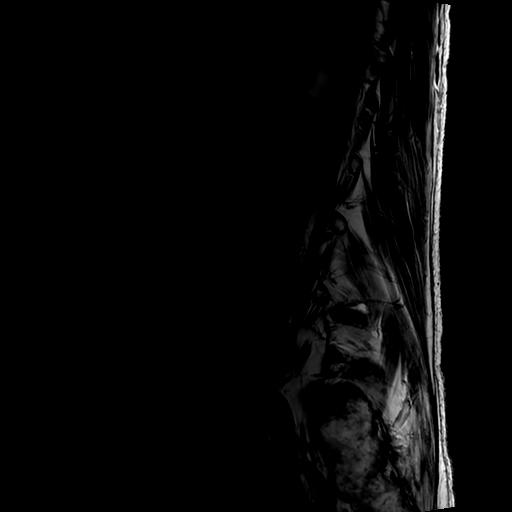
[im 4/15]
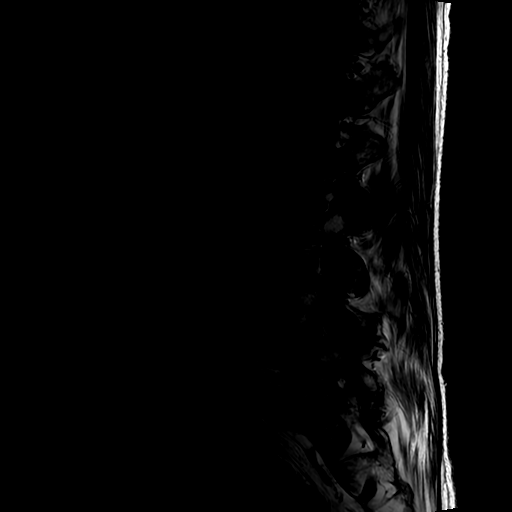
[im 8/15]
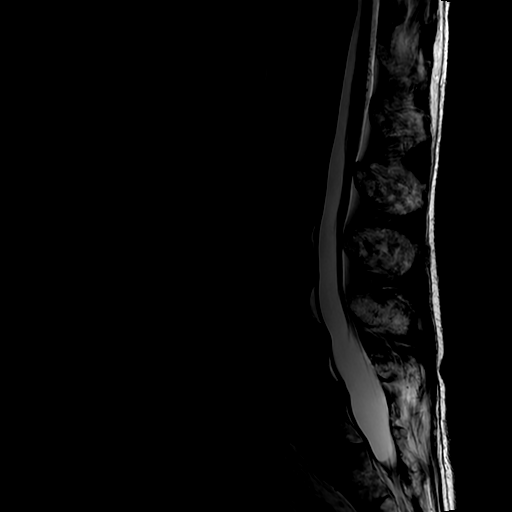
[im 11/15]
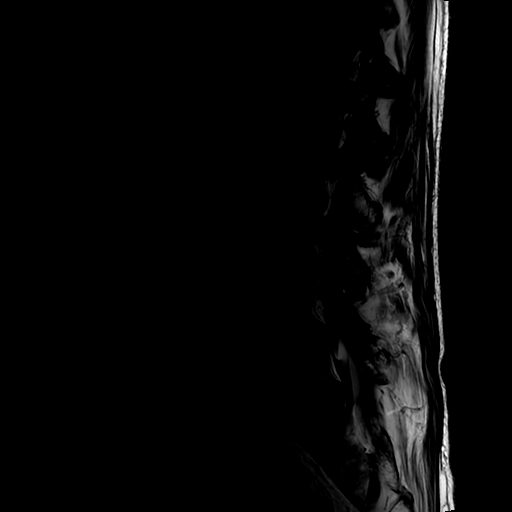
[im 15/15]
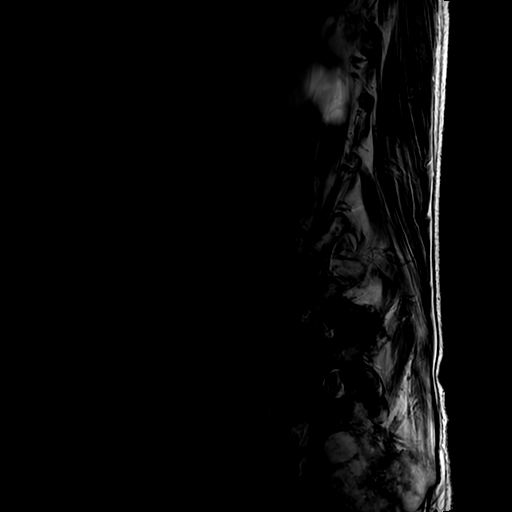

[Series 5: T1 · sagittal · 4.0mm · 0.51mm/px · 3 of 15 slices shown (1 of 2)]
[im 3/15]
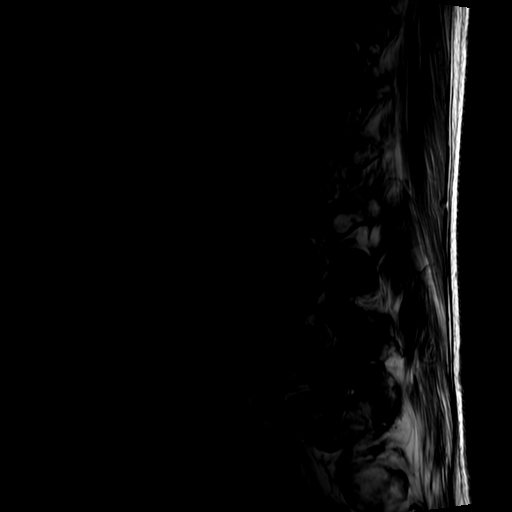
[im 9/15]
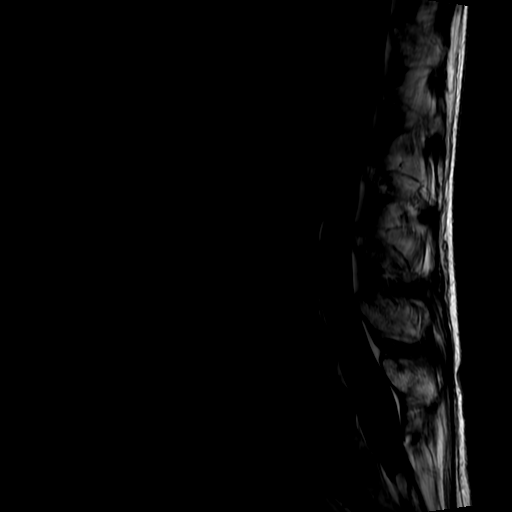
[im 15/15]
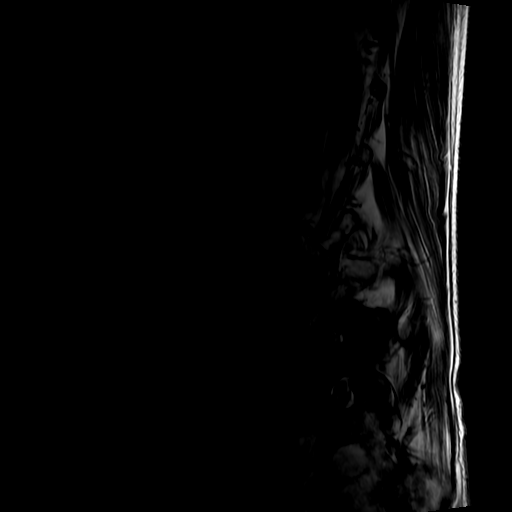

[Series 6: T2 · axial · 4.0mm · 0.39mm/px · z∈[+55,+245]mm · 7 of 42 slices shown (2 of 2)]
[im 3/42]
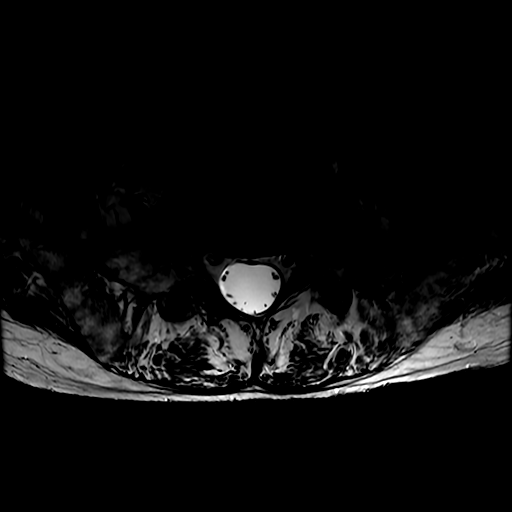
[im 6/42]
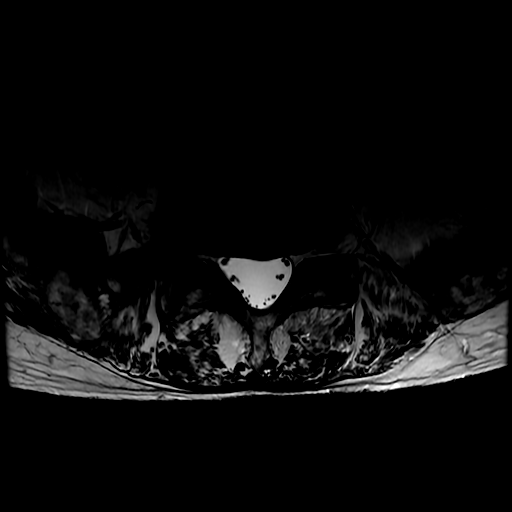
[im 9/42]
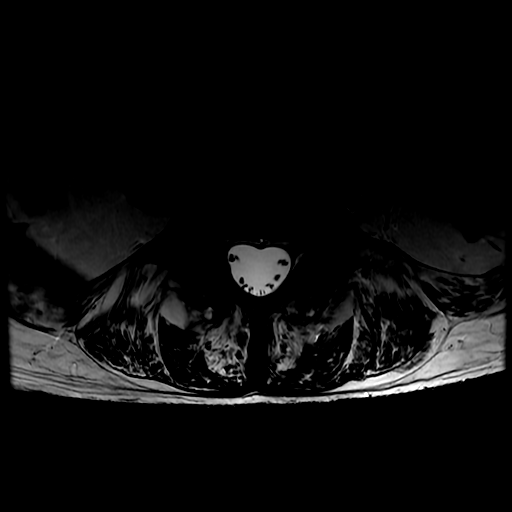
[im 14/42]
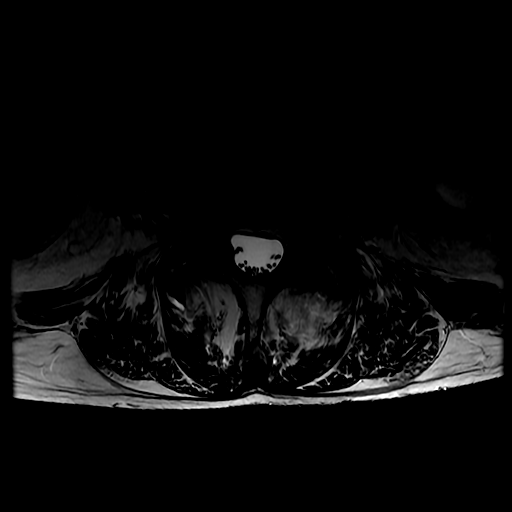
[im 20/42]
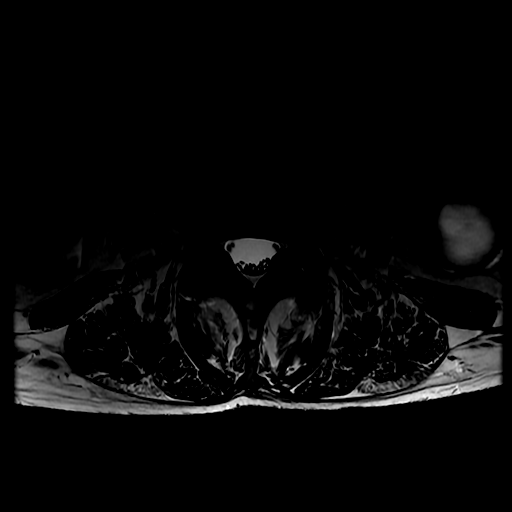
[im 22/42]
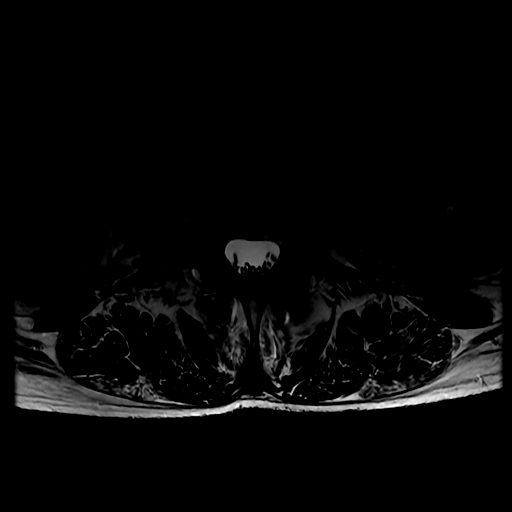
[im 36/42]
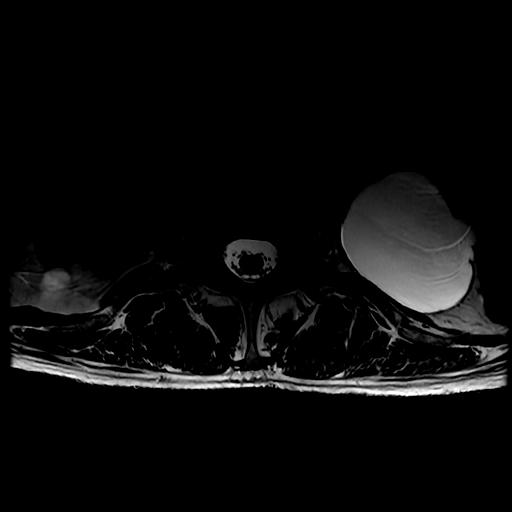

[Series 7: T1 · axial · 4.0mm · 0.39mm/px · z∈[+70,+245]mm · 3 of 42 slices shown (2 of 2)]
[im 6/42]
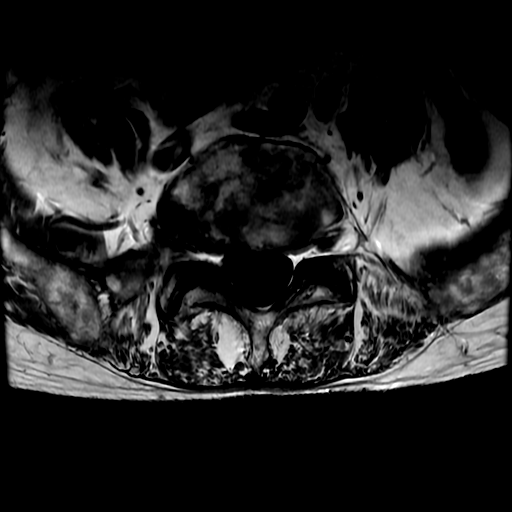
[im 22/42]
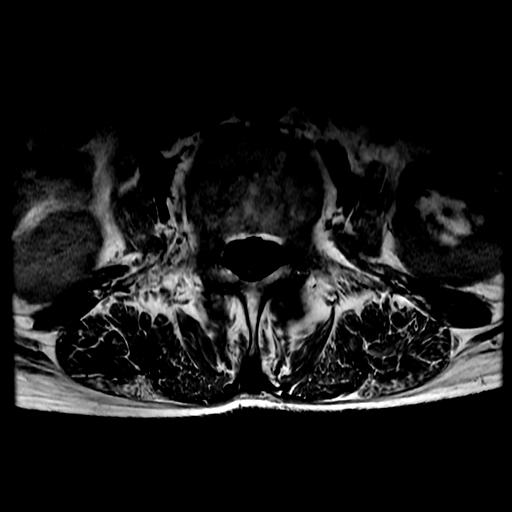
[im 36/42]
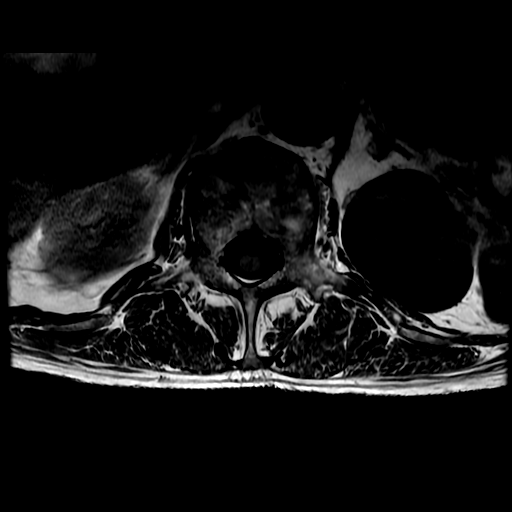

[18 of 48 positions shown; findings below may reference images not displayed]

FINDINGS: Segmentation: There are five lumbar type vertebral bodies. The last
full intervertebral disc space is labeled L5-S1. This correlates
with the prior study.

Alignment: Normal overall alignment. Slight degenerative
anterolisthesis of L4.

Vertebrae: Patchy E heterogeneous T1 marrow signal likely due to
smoking, anemia or osteoporosis. Slightly progressive since the
prior MRI. No significant abnormal STIR signal intensity or discrete
bone lesions. Stable endplate reactive changes at L5-S1.

Conus medullaris and cauda equina: Conus extends to the L1-2 level.
Conus and cauda equina appear normal.

Paraspinal and other soft tissues: Bilateral renal cysts are noted.
No retroperitoneal mass or adenopathy. The aorta is normal in
caliber.

Disc levels:

T12-L1: No significant findings.

L1-2: No significant findings.

L2-3: Mild annular bulge. No spinal, lateral recess or foraminal
stenosis.

L3-4: Mild annular bulge and moderate facet disease with mild
lateral recess encroachment bilaterally. No focal disc protrusion,
significant spinal or foraminal stenosis.

L4-5: Bulging slightly uncovered disc and moderate to advanced facet
disease. There is slight flattening of the ventral thecal sac the
spinal canal is generous in is no significant spinal, lateral recess
or foraminal stenosis.

L5-S1: Moderate degenerative disc disease but no disc protrusions,
spinal or lateral recess stenosis. Mild right foraminal stenosis due
to osteophytic spurring right-sided facet disease. This appears
stable to slightly improved when compared to the prior study.
IMPRESSION: 1. Mild lateral recess encroachment bilaterally at L3-4.
2. Stable to slightly improved right foraminal stenosis at L5-S1.

## 2024-06-09 ENCOUNTER — Other Ambulatory Visit (HOSPITAL_BASED_OUTPATIENT_CLINIC_OR_DEPARTMENT_OTHER): Payer: Self-pay | Admitting: Adult Health Nurse Practitioner

## 2024-06-09 DIAGNOSIS — R634 Abnormal weight loss: Secondary | ICD-10-CM
